# Patient Record
Sex: Male | Born: 1970 | Race: White | Hispanic: No | Smoking: Current every day smoker
Health system: Southern US, Community
[De-identification: ages and names within clinical notes are randomized; demographics above are authoritative.]

## PROBLEM LIST (undated history)

## (undated) DIAGNOSIS — I10 Essential (primary) hypertension: Secondary | ICD-10-CM

## (undated) DIAGNOSIS — E785 Hyperlipidemia, unspecified: Secondary | ICD-10-CM

## (undated) DIAGNOSIS — I82409 Acute embolism and thrombosis of unspecified deep veins of unspecified lower extremity: Secondary | ICD-10-CM

## (undated) DIAGNOSIS — C801 Malignant (primary) neoplasm, unspecified: Secondary | ICD-10-CM

## (undated) HISTORY — PX: TONSILLECTOMY: SUR1361

## (undated) HISTORY — PX: CHOLECYSTECTOMY: SHX55

## (undated) HISTORY — PX: HERNIA REPAIR: SHX51

---

## 2000-03-25 ENCOUNTER — Encounter: Admission: RE | Admit: 2000-03-25 | Discharge: 2000-04-03 | Payer: Self-pay | Admitting: Podiatry

## 2001-06-25 ENCOUNTER — Encounter: Payer: Self-pay | Admitting: Emergency Medicine

## 2001-06-25 ENCOUNTER — Emergency Department (HOSPITAL_COMMUNITY): Admission: AC | Admit: 2001-06-25 | Discharge: 2001-06-25 | Payer: Self-pay

## 2003-11-28 ENCOUNTER — Emergency Department (HOSPITAL_COMMUNITY): Admission: EM | Admit: 2003-11-28 | Discharge: 2003-11-28 | Payer: Self-pay | Admitting: Emergency Medicine

## 2004-01-16 ENCOUNTER — Emergency Department (HOSPITAL_COMMUNITY): Admission: EM | Admit: 2004-01-16 | Discharge: 2004-01-16 | Payer: Self-pay | Admitting: Emergency Medicine

## 2004-06-12 ENCOUNTER — Inpatient Hospital Stay (HOSPITAL_COMMUNITY): Admission: EM | Admit: 2004-06-12 | Discharge: 2004-06-20 | Payer: Self-pay | Admitting: Emergency Medicine

## 2004-07-26 ENCOUNTER — Ambulatory Visit (HOSPITAL_COMMUNITY): Admission: RE | Admit: 2004-07-26 | Discharge: 2004-07-26 | Payer: Self-pay | Admitting: Internal Medicine

## 2005-07-19 ENCOUNTER — Emergency Department (HOSPITAL_COMMUNITY): Admission: EM | Admit: 2005-07-19 | Discharge: 2005-07-19 | Payer: Self-pay | Admitting: Emergency Medicine

## 2005-10-01 ENCOUNTER — Emergency Department (HOSPITAL_COMMUNITY): Admission: EM | Admit: 2005-10-01 | Discharge: 2005-10-01 | Payer: Self-pay | Admitting: Emergency Medicine

## 2005-11-07 ENCOUNTER — Emergency Department (HOSPITAL_COMMUNITY): Admission: EM | Admit: 2005-11-07 | Discharge: 2005-11-07 | Payer: Self-pay | Admitting: Emergency Medicine

## 2005-12-09 ENCOUNTER — Emergency Department (HOSPITAL_COMMUNITY): Admission: EM | Admit: 2005-12-09 | Discharge: 2005-12-09 | Payer: Self-pay | Admitting: Emergency Medicine

## 2005-12-12 ENCOUNTER — Emergency Department (HOSPITAL_COMMUNITY): Admission: EM | Admit: 2005-12-12 | Discharge: 2005-12-12 | Payer: Self-pay | Admitting: Emergency Medicine

## 2006-03-17 IMAGING — RF DG ERCP WO/W SPHINCTEROTOMY
1 series · 8 of 8 positions shown · non-contrast
Comparison: none

CLINICAL DATA: bile duct stent removal
 ERCP WITH SPHINCTEROTOMY
 ERCP with sphincterotomy and stent removal performed by Dr. Manzoni. Images were obtained during the procedure. These demonstrate normal caliber common bile duct with what appear to be small filling defects in the common bile duct.  Following visualization of the filling defects, a sphincterotomy was performed and a balloon was passed with retrieval of multiple small stones from the duct.  Surgical drain noted right upper quadrant.  No biliary dilatation seen.
 IMPRESSION
 Small filling defects in the common bile duct with extraction of stones following sphincterotomy.

[Series 903: run · 8 of 8 slices shown]
[im 1/8]
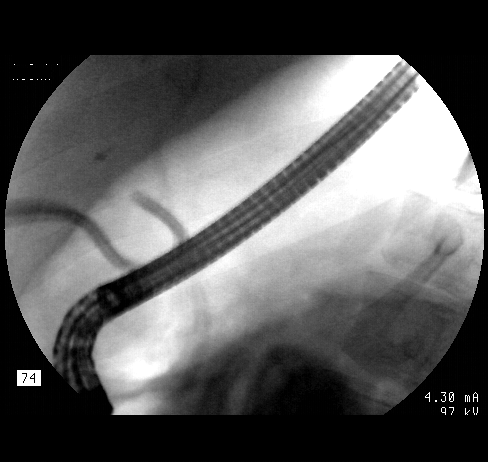
[im 2/8]
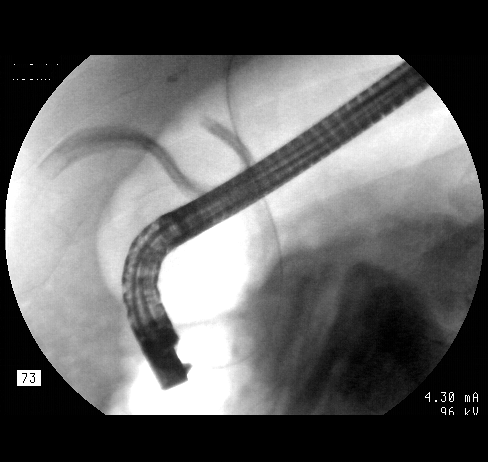
[im 3/8]
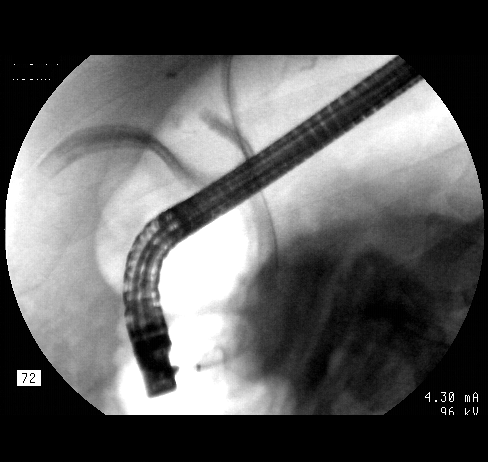
[im 4/8]
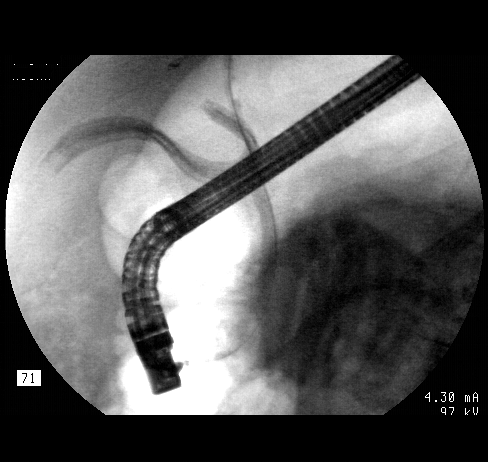
[im 5/8]
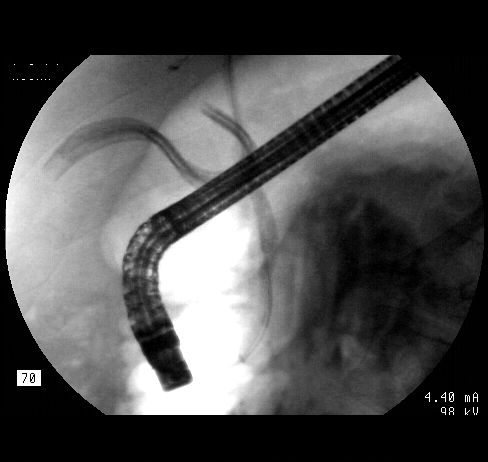
[im 6/8]
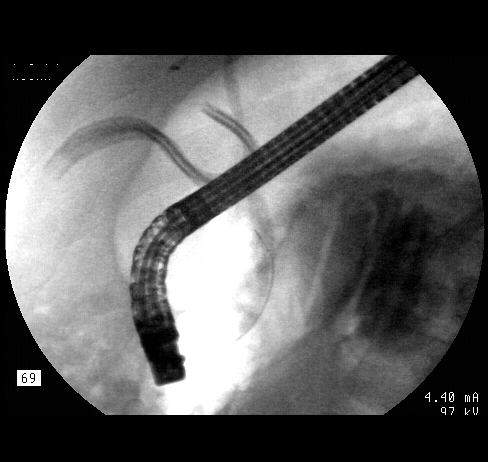
[im 7/8]
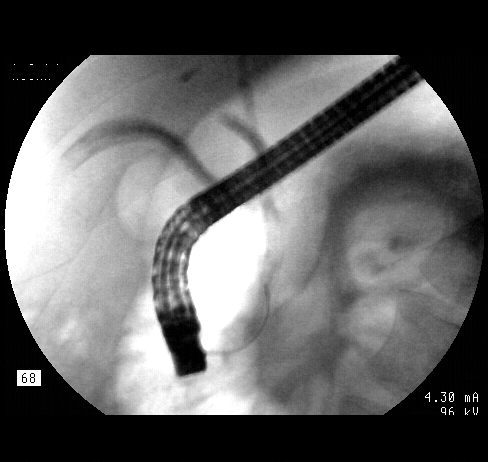
[im 8/8]
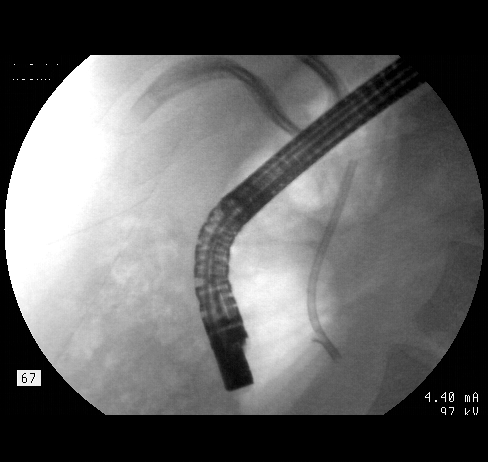

[8 of 8 positions shown; findings below may reference images not displayed]

## 2011-01-20 ENCOUNTER — Encounter: Payer: Self-pay | Admitting: Internal Medicine

## 2013-08-23 ENCOUNTER — Emergency Department (HOSPITAL_COMMUNITY)
Admission: EM | Admit: 2013-08-23 | Discharge: 2013-08-23 | Disposition: A | Discharge status: Home / Self Care | Payer: Self-pay | Attending: Emergency Medicine | Admitting: Emergency Medicine

## 2013-08-23 ENCOUNTER — Encounter (HOSPITAL_COMMUNITY): Payer: Self-pay | Admitting: *Deleted

## 2013-08-23 DIAGNOSIS — Z8639 Personal history of other endocrine, nutritional and metabolic disease: Secondary | ICD-10-CM | POA: Insufficient documentation

## 2013-08-23 DIAGNOSIS — R110 Nausea: Secondary | ICD-10-CM | POA: Insufficient documentation

## 2013-08-23 DIAGNOSIS — Z79899 Other long term (current) drug therapy: Secondary | ICD-10-CM | POA: Insufficient documentation

## 2013-08-23 DIAGNOSIS — K029 Dental caries, unspecified: Secondary | ICD-10-CM | POA: Insufficient documentation

## 2013-08-23 DIAGNOSIS — Z862 Personal history of diseases of the blood and blood-forming organs and certain disorders involving the immune mechanism: Secondary | ICD-10-CM | POA: Insufficient documentation

## 2013-08-23 DIAGNOSIS — K047 Periapical abscess without sinus: Secondary | ICD-10-CM | POA: Insufficient documentation

## 2013-08-23 DIAGNOSIS — R220 Localized swelling, mass and lump, head: Secondary | ICD-10-CM | POA: Insufficient documentation

## 2013-08-23 HISTORY — DX: Hyperlipidemia, unspecified: E78.5

## 2013-08-23 MED ORDER — AMOXICILLIN 500 MG PO CAPS: 500.0000 mg | ORAL_CAPSULE | Freq: Three times a day (TID) | ORAL | Status: AC

## 2013-08-23 MED ORDER — IBUPROFEN 800 MG PO TABS: 800.0000 mg | ORAL_TABLET | Freq: Three times a day (TID) | ORAL | Status: DC

## 2013-08-23 MED ORDER — TRAMADOL HCL 50 MG PO TABS: 50.0000 mg | ORAL_TABLET | Freq: Four times a day (QID) | ORAL | Status: DC | PRN

## 2013-08-23 NOTE — ED Provider Notes (Signed)
Medical screening examination/treatment/procedure(s) were performed by non-physician practitioner and as supervising physician I was immediately available for consultation/collaboration.   Kaida Games L Salinda Snedeker, MD 08/23/13 2022 

## 2013-08-23 NOTE — ED Notes (Signed)
Dental pain lt mandibular molar

## 2013-08-23 NOTE — ED Provider Notes (Signed)
CSN: [id number]     Arrival date & time 08/23/13  1424 History     First MD Initiated Contact with Patient 08/23/13 1447     Chief Complaint  Patient presents with  . Dental Pain   (Consider location/radiation/quality/duration/timing/severity/associated sxs/prior Treatment) HPI Comments: Carlos Sullivan is a 42 y.o. Male with 1 day history of dental pain and gingival swelling.   The patient has a history of decay and an old filling in the tooth which has recently started to cause pain, severe since yesterday.  There has been no fevers,  Chills or vomiting but states the pain is making him nauseated.  He has no complaint of difficulty swallowing,  Although chewing makes pain worse.  The patient has tried tylenol without relief of symptoms.         The history is provided by the patient.    Past Medical History  Diagnosis Date  . Hyperlipemia    Past Surgical History  Procedure Laterality Date  . Cholecystectomy     History reviewed. No pertinent family history. History  Substance Use Topics  . Smoking status: Never Smoker   . Smokeless tobacco: Not on file  . Alcohol Use: No    Review of Systems  Constitutional: Negative for fever.  HENT: Positive for dental problem. Negative for sore throat, facial swelling, neck pain and neck stiffness.   Respiratory: Negative for shortness of breath.     Allergies  Review of patient's allergies indicates no known allergies.  Home Medications   Current Outpatient Rx  Name  Route  Sig  Dispense  Refill  . citalopram (CELEXA) 10 MG tablet   Oral   Take 10 mg by mouth daily.         . traZODone (DESYREL) 100 MG tablet   Oral   Take 150 mg by mouth at bedtime.         Marland Kitchen amoxicillin (AMOXIL) 500 MG capsule   Oral   Take 1 capsule (500 mg total) by mouth 3 (three) times daily.   30 capsule   0   . ibuprofen (ADVIL,MOTRIN) 800 MG tablet   Oral   Take 1 tablet (800 mg total) by mouth 3 (three) times daily.   21  tablet   0   . traMADol (ULTRAM) 50 MG tablet   Oral   Take 1 tablet (50 mg total) by mouth every 6 (six) hours as needed for pain.   15 tablet   0    BP 141/87  Pulse 97  Temp(Src) 98.2 F (36.8 C) (Oral)  Resp 20  Ht 6\' 3"  (1.905 m)  Wt 265 lb (120.203 kg)  BMI 33.12 kg/m2  SpO2 98% Physical Exam  Nursing note and vitals reviewed. Constitutional: He is oriented to person, place, and time. He appears well-developed and well-nourished. No distress.  HENT:  Head: Normocephalic and atraumatic.  Right Ear: Tympanic membrane and external ear normal.  Left Ear: Tympanic membrane and external ear normal.  Nose: Nose normal.  Mouth/Throat: Oropharynx is clear and moist and mucous membranes are normal. No oral lesions. No trismus in the jaw. Dental abscesses and dental caries present.  Old filling in left lower 2nd molar tooth which appears fractured.  There is gingival hyperemia, ttp, no fluctuance.  Tender along left mandible without erythema or induration, although he has mild edema. Left submandibular node tender.  Eyes: Conjunctivae are normal.  Neck: Normal range of motion. Neck supple.  Cardiovascular: Normal rate, regular rhythm,  normal heart sounds and intact distal pulses.   Pulmonary/Chest: Effort normal and breath sounds normal. He has no wheezes.  Abdominal: Soft. Bowel sounds are normal. He exhibits no distension. There is no tenderness.  Musculoskeletal: Normal range of motion.  Lymphadenopathy:    He has no cervical adenopathy.  Neurological: He is alert and oriented to person, place, and time.  Skin: Skin is warm and dry. No erythema.  Psychiatric: He has a normal mood and affect.    ED Course   Procedures (including critical care time)  Labs Reviewed - No data to display No results found. 1. Dental abscess     MDM  Pt was prescribed ibuprofen, amoxil, tramadol.  Dental referrals given.  Pt stable.  No abscess on exam.  Burgess Amor, PA-C 08/23/13 1616

## 2013-09-12 ENCOUNTER — Encounter (HOSPITAL_COMMUNITY): Payer: Self-pay | Admitting: *Deleted

## 2013-09-12 ENCOUNTER — Emergency Department (HOSPITAL_COMMUNITY)
Admission: EM | Admit: 2013-09-12 | Discharge: 2013-09-12 | Disposition: A | Discharge status: Home / Self Care | Payer: Self-pay | Attending: Emergency Medicine | Admitting: Emergency Medicine

## 2013-09-12 DIAGNOSIS — Z862 Personal history of diseases of the blood and blood-forming organs and certain disorders involving the immune mechanism: Secondary | ICD-10-CM | POA: Insufficient documentation

## 2013-09-12 DIAGNOSIS — Z8639 Personal history of other endocrine, nutritional and metabolic disease: Secondary | ICD-10-CM | POA: Insufficient documentation

## 2013-09-12 DIAGNOSIS — K5900 Constipation, unspecified: Secondary | ICD-10-CM | POA: Insufficient documentation

## 2013-09-12 DIAGNOSIS — Z79899 Other long term (current) drug therapy: Secondary | ICD-10-CM | POA: Insufficient documentation

## 2013-09-12 MED ORDER — PEG 3350-KCL-NABCB-NACL-NASULF 240 G PO SOLR: 4000.0000 mL | Freq: Once | ORAL | Status: DC

## 2013-09-12 NOTE — ED Notes (Signed)
Pt reporting last BM was 3 days ago.  States that he has taken mineral oil about 3 times per day, for past 2-3 days, with no relief.

## 2013-09-12 NOTE — ED Provider Notes (Signed)
CSN: [id number]     Arrival date & time 09/12/13  0017 History   First MD Initiated Contact with Patient 09/12/13 0258     Chief Complaint  Patient presents with  . Constipation   (Consider location/radiation/quality/duration/timing/severity/associated sxs/prior Treatment) Patient is a 42 y.o. male presenting with constipation. The history is provided by the patient.  Constipation He states that he has not had a bowel movement in the last 3 days. He feels bloated and has tried to treat himself with mineral oil without benefit. He tried a digital rectal exam to move the stool but was not successful. He feels like there is just a large piece of stool which he is not able to pass. Denies nausea vomiting denies fever or chills.  Past Medical History  Diagnosis Date  . Hyperlipemia    Past Surgical History  Procedure Laterality Date  . Cholecystectomy     History reviewed. No pertinent family history. History  Substance Use Topics  . Smoking status: Never Smoker   . Smokeless tobacco: Not on file  . Alcohol Use: No    Review of Systems  Gastrointestinal: Positive for constipation.  All other systems reviewed and are negative.    Allergies  Codeine  Home Medications   Current Outpatient Rx  Name  Route  Sig  Dispense  Refill  . citalopram (CELEXA) 10 MG tablet   Oral   Take 10 mg by mouth daily.         . traZODone (DESYREL) 100 MG tablet   Oral   Take 150 mg by mouth at bedtime.         Marland Kitchen ibuprofen (ADVIL,MOTRIN) 800 MG tablet   Oral   Take 1 tablet (800 mg total) by mouth 3 (three) times daily.   21 tablet   0   . traMADol (ULTRAM) 50 MG tablet   Oral   Take 1 tablet (50 mg total) by mouth every 6 (six) hours as needed for pain.   15 tablet   0    BP 134/91  Pulse 115  Temp(Src) 99 F (37.2 C)  Resp 20  Ht 6\' 3"  (1.905 m)  Wt 265 lb (120.203 kg)  BMI 33.12 kg/m2 Physical Exam  Nursing note and vitals reviewed.  42 year old male, resting  comfortably and in no acute distress. Vital signs are significant for borderline hypertension with blood pressure 134/91, and tachycardia with heart rate 115.  Head is normocephalic and atraumatic. PERRLA, EOMI. Oropharynx is clear. Neck is nontender and supple without adenopathy or JVD. Back is nontender and there is no CVA tenderness. Lungs are clear without rales, wheezes, or rhonchi. Chest is nontender. Heart has regular rate and rhythm without murmur. Abdomen is soft, flat, nontender without masses or hepatosplenomegaly and peristalsis is normoactive. Rectal: Normal sphincter tone, moderate amount of stool which is moderately hard but not causing an impaction. Extremities have no cyanosis or edema, full range of motion is present. Skin is warm and dry without rash. Neurologic: Mental status is normal, cranial nerves are intact, there are no motor or sensory deficits.  ED Course  Procedures (including critical care time)  MDM   1. Constipation    Constipation without fecal impaction. He is discharged with a prescription for polyethylene glycol and given instructions regarding prevention of constipation.    Dione Booze, MD 09/12/13 [phone]

## 2014-06-04 ENCOUNTER — Emergency Department (HOSPITAL_COMMUNITY)
Admission: EM | Admit: 2014-06-04 | Discharge: 2014-06-05 | Disposition: A | Discharge status: Home / Self Care | Payer: Self-pay | Attending: Emergency Medicine | Admitting: Emergency Medicine

## 2014-06-04 ENCOUNTER — Encounter (HOSPITAL_COMMUNITY): Payer: Self-pay | Admitting: Emergency Medicine

## 2014-06-04 DIAGNOSIS — Z862 Personal history of diseases of the blood and blood-forming organs and certain disorders involving the immune mechanism: Secondary | ICD-10-CM | POA: Insufficient documentation

## 2014-06-04 DIAGNOSIS — K029 Dental caries, unspecified: Secondary | ICD-10-CM | POA: Insufficient documentation

## 2014-06-04 DIAGNOSIS — Z79899 Other long term (current) drug therapy: Secondary | ICD-10-CM | POA: Insufficient documentation

## 2014-06-04 DIAGNOSIS — F17200 Nicotine dependence, unspecified, uncomplicated: Secondary | ICD-10-CM | POA: Insufficient documentation

## 2014-06-04 DIAGNOSIS — K047 Periapical abscess without sinus: Secondary | ICD-10-CM | POA: Insufficient documentation

## 2014-06-04 DIAGNOSIS — Z8639 Personal history of other endocrine, nutritional and metabolic disease: Secondary | ICD-10-CM | POA: Insufficient documentation

## 2014-06-04 DIAGNOSIS — Z791 Long term (current) use of non-steroidal anti-inflammatories (NSAID): Secondary | ICD-10-CM | POA: Insufficient documentation

## 2014-06-04 NOTE — ED Provider Notes (Signed)
CSN: [id number]     Arrival date & time 06/04/14  2304 History   First MD Initiated Contact with Patient 06/04/14 2354     Chief Complaint  Patient presents with  . Dental Pain     (Consider location/radiation/quality/duration/timing/severity/associated sxs/prior Treatment) Patient is a 43 y.o. male presenting with tooth pain. The history is provided by the patient.  Dental Pain Location:  Lower Lower teeth location:  31/RL 2nd molar Quality:  Throbbing Severity:  Moderate Onset quality:  Gradual Duration:  2 days Timing:  Constant Progression:  Worsening Chronicity:  New Context: dental caries and poor dentition   Relieved by:  Nothing Worsened by:  Cold food/drink and pressure Ineffective treatments:  NSAIDs Associated symptoms: facial pain    Carlos Sullivan is a 43 y.o. male who presents to the ED with dental pain. He states he has been having some problems for the past month but last night the pain got really bad. He has several decayed teeth. He took Aleve, used an ice pack and the pain was still severe.   Past Medical History  Diagnosis Date  . Hyperlipemia    Past Surgical History  Procedure Laterality Date  . Cholecystectomy     No family history on file. History  Substance Use Topics  . Smoking status: Current Every Day Smoker  . Smokeless tobacco: Not on file  . Alcohol Use: No    Review of Systems Negative except as stated in HPI   Allergies  Codeine and Hydrocodone  Home Medications   Prior to Admission medications   Medication Sig Start Date End Date Taking? Authorizing Provider  ibuprofen (ADVIL,MOTRIN) 800 MG tablet Take 1 tablet (800 mg total) by mouth 3 (three) times daily. 08/23/13  Yes Raynelle FanningJulie Idol, PA-C  citalopram (CELEXA) 10 MG tablet Take 10 mg by mouth daily.    Historical Provider, MD  polyethylene glycol (COLYTE) 240 G solution Take 4,000 mLs by mouth once. Drink 8 ounces every 15 minutes until you are passing clear liquid 09/12/13    Dione Boozeavid Glick, MD  traMADol (ULTRAM) 50 MG tablet Take 1 tablet (50 mg total) by mouth every 6 (six) hours as needed for pain. 08/23/13   Burgess AmorJulie Idol, PA-C  traZODone (DESYREL) 100 MG tablet Take 150 mg by mouth at bedtime.    Historical Provider, MD   BP 145/92  Pulse 72  Temp(Src) 99.1 F (37.3 C) (Oral)  Resp 20  Ht 6\' 3"  (1.905 m)  Wt 245 lb (111.131 kg)  BMI 30.62 kg/m2  SpO2 98% Physical Exam  Nursing note and vitals reviewed. Constitutional: He is oriented to person, place, and time. He appears well-developed and well-nourished. No distress.  HENT:  Head: Normocephalic.  Right Ear: Tympanic membrane normal.  Left Ear: Tympanic membrane normal.  Mouth/Throat: Uvula is midline, oropharynx is clear and moist and mucous membranes are normal.    Right lower second molar with decay and swelling and erythema of the gum surrounding the tooth.   Eyes: Conjunctivae and EOM are normal.  Neck: Neck supple.  Cardiovascular: Normal rate.   Pulmonary/Chest: Effort normal.  Abdominal: Soft. There is no tenderness.  Musculoskeletal: Normal range of motion.  Lymphadenopathy:    He has no cervical adenopathy.  Neurological: He is alert and oriented to person, place, and time. No cranial nerve deficit.  Skin: Skin is warm and dry.  Psychiatric: He has a normal mood and affect. His behavior is normal.    ED Course  Procedures  MDM  43 y.o. male with dental pain due to abscess and dental caries. Will treat with antibiotics and pain medication. He will see a dentist as soon as possible. Discussed with the patient and all questioned fully answered.    Medication List    TAKE these medications       amoxicillin 500 MG capsule  Commonly known as:  AMOXIL  Take 1 capsule (500 mg total) by mouth 3 (three) times daily.     traMADol 50 MG tablet  Commonly known as:  ULTRAM  Take 1 tablet (50 mg total) by mouth every 6 (six) hours as needed.      ASK your doctor about these medications         citalopram 10 MG tablet  Commonly known as:  CELEXA  Take 10 mg by mouth daily.     ibuprofen 800 MG tablet  Commonly known as:  ADVIL,MOTRIN  Take 1 tablet (800 mg total) by mouth 3 (three) times daily.  Ask about: Which instructions should I use?     ibuprofen 800 MG tablet  Commonly known as:  ADVIL,MOTRIN  Take 1 tablet (800 mg total) by mouth 3 (three) times daily.  Ask about: Which instructions should I use?     polyethylene glycol 240 G solution  Commonly known as:  COLYTE  Take 4,000 mLs by mouth once. Drink 8 ounces every 15 minutes until you are passing clear liquid     traZODone 100 MG tablet  Commonly known as:  DESYREL  Take 150 mg by mouth at bedtime.         [address] [place], [place] 06/05/14 [id number]

## 2014-06-04 NOTE — ED Notes (Signed)
Patient c/o right lower back tooth pain since Friday.

## 2014-06-05 MED ORDER — TRAMADOL HCL 50 MG PO TABS
50.0000 mg | ORAL_TABLET | Freq: Once | ORAL | Status: AC
  Administered 2014-06-05: 50 mg via ORAL
  Filled 2014-06-05: qty 1

## 2014-06-05 MED ORDER — AMOXICILLIN 250 MG PO CAPS
500.0000 mg | ORAL_CAPSULE | Freq: Once | ORAL | Status: AC
  Administered 2014-06-05: 500 mg via ORAL
  Filled 2014-06-05: qty 2

## 2014-06-05 MED ORDER — TRAMADOL HCL 50 MG PO TABS: 50.0000 mg | ORAL_TABLET | Freq: Four times a day (QID) | ORAL | Status: DC | PRN

## 2014-06-05 MED ORDER — IBUPROFEN 800 MG PO TABS: 800.0000 mg | ORAL_TABLET | Freq: Three times a day (TID) | ORAL | Status: DC

## 2014-06-05 MED ORDER — AMOXICILLIN 500 MG PO CAPS: 500.0000 mg | ORAL_CAPSULE | Freq: Three times a day (TID) | ORAL | Status: DC

## 2014-06-05 NOTE — Discharge Instructions (Signed)
°  Dental Abscess °A dental abscess is a collection of infected fluid (pus) from a bacterial infection in the inner part of the tooth (pulp). It usually occurs at the end of the tooth's root.  °CAUSES  °· Severe tooth decay. °· Trauma to the tooth that allows bacteria to enter into the pulp, such as a broken or chipped tooth. °SYMPTOMS  °· Severe pain in and around the infected tooth. °· Swelling and redness around the abscessed tooth or in the mouth or face. °· Tenderness. °· Pus drainage. °· Bad breath. °· Bitter taste in the mouth. °· Difficulty swallowing. °· Difficulty opening the mouth. °· Nausea. °· Vomiting. °· Chills. °· Swollen neck glands. °DIAGNOSIS  °· A medical and dental history will be taken. °· An examination will be performed by tapping on the abscessed tooth. °· X-rays may be taken of the tooth to identify the abscess. °TREATMENT °The goal of treatment is to eliminate the infection. You may be prescribed antibiotic medicine to stop the infection from spreading. A root canal may be performed to save the tooth. If the tooth cannot be saved, it may be pulled (extracted) and the abscess may be drained.  °HOME CARE INSTRUCTIONS °· Only take over-the-counter or prescription medicines for pain, fever, or discomfort as directed by your caregiver. °· Rinse your mouth (gargle) often with salt water (¼ tsp salt in 8 oz [250 ml] of warm water) to relieve pain or swelling. °· Do not drive after taking pain medicine (narcotics). °· Do not apply heat to the outside of your face. °· Return to your dentist for further treatment as directed. °SEEK MEDICAL CARE IF: °· Your pain is not helped by medicine. °· Your pain is getting worse instead of better. °SEEK IMMEDIATE MEDICAL CARE IF: °· You have a fever or persistent symptoms for more than 2 3 days. °· You have a fever and your symptoms suddenly get worse. °· You have chills or a very bad headache. °· You have problems breathing or swallowing. °· You have trouble  opening your mouth. °· You have swelling in the neck or around the eye. °Document Released: 12/16/2005 Document Revised: 09/09/2012 Document Reviewed: 03/26/2011 °[employer]® Patient Information ©2014 [employer]. ° ° °

## 2014-06-05 NOTE — ED Provider Notes (Signed)
Medical screening examination/treatment/procedure(s) were performed by non-physician practitioner and as supervising physician I was immediately available for consultation/collaboration.   Mainor Hellmann, MD 06/05/14 0342 

## 2024-05-11 ENCOUNTER — Other Ambulatory Visit: Payer: Self-pay

## 2024-05-11 ENCOUNTER — Encounter (HOSPITAL_COMMUNITY): Payer: Self-pay

## 2024-05-11 ENCOUNTER — Emergency Department (HOSPITAL_COMMUNITY)
Admission: EM | Admit: 2024-05-11 | Discharge: 2024-05-11 | Discharge status: Against Medical Advice | Payer: Self-pay | Attending: Emergency Medicine | Admitting: Emergency Medicine

## 2024-05-11 DIAGNOSIS — Z5321 Procedure and treatment not carried out due to patient leaving prior to being seen by health care provider: Secondary | ICD-10-CM | POA: Insufficient documentation

## 2024-05-11 DIAGNOSIS — M7989 Other specified soft tissue disorders: Secondary | ICD-10-CM | POA: Insufficient documentation

## 2024-05-11 NOTE — ED Triage Notes (Signed)
 BIB EMS from [facility] for left leg swelling that is worsening over the last 3 weeks. Leg is significantly swollen all the way up to hip. Denies pain, unless he hits leg on something.

## 2024-05-11 NOTE — ED Notes (Signed)
 Pt left.

## 2024-05-12 ENCOUNTER — Other Ambulatory Visit: Payer: Self-pay

## 2024-05-12 ENCOUNTER — Encounter (HOSPITAL_COMMUNITY): Payer: Self-pay

## 2024-05-12 ENCOUNTER — Emergency Department (EMERGENCY_DEPARTMENT_HOSPITAL)
Admit: 2024-05-12 | Discharge: 2024-05-12 | Disposition: A | Discharge status: Home / Self Care | Payer: Self-pay | Attending: Emergency Medicine | Admitting: Emergency Medicine

## 2024-05-12 ENCOUNTER — Emergency Department (HOSPITAL_COMMUNITY): Payer: Self-pay

## 2024-05-12 ENCOUNTER — Emergency Department (HOSPITAL_COMMUNITY)
Admission: EM | Admit: 2024-05-12 | Discharge: 2024-05-12 | Discharge status: Against Medical Advice | Payer: Self-pay | Attending: Emergency Medicine | Admitting: Emergency Medicine

## 2024-05-12 DIAGNOSIS — I82412 Acute embolism and thrombosis of left femoral vein: Secondary | ICD-10-CM | POA: Insufficient documentation

## 2024-05-12 DIAGNOSIS — Z79899 Other long term (current) drug therapy: Secondary | ICD-10-CM | POA: Insufficient documentation

## 2024-05-12 DIAGNOSIS — M7989 Other specified soft tissue disorders: Secondary | ICD-10-CM

## 2024-05-12 DIAGNOSIS — M5431 Sciatica, right side: Secondary | ICD-10-CM | POA: Insufficient documentation

## 2024-05-12 DIAGNOSIS — Z5329 Procedure and treatment not carried out because of patient's decision for other reasons: Secondary | ICD-10-CM | POA: Insufficient documentation

## 2024-05-12 DIAGNOSIS — I82422 Acute embolism and thrombosis of left iliac vein: Secondary | ICD-10-CM | POA: Insufficient documentation

## 2024-05-12 DIAGNOSIS — C7951 Secondary malignant neoplasm of bone: Secondary | ICD-10-CM | POA: Insufficient documentation

## 2024-05-12 DIAGNOSIS — C7949 Secondary malignant neoplasm of other parts of nervous system: Secondary | ICD-10-CM | POA: Insufficient documentation

## 2024-05-12 DIAGNOSIS — I10 Essential (primary) hypertension: Secondary | ICD-10-CM | POA: Insufficient documentation

## 2024-05-12 HISTORY — DX: Essential (primary) hypertension: I10

## 2024-05-12 HISTORY — DX: Malignant (primary) neoplasm, unspecified: C80.1

## 2024-05-12 LAB — CBC WITH DIFFERENTIAL/PLATELET
Abs Immature Granulocytes: 0.01 10*3/uL (ref 0.00–0.07)
Basophils Absolute: 0 10*3/uL (ref 0.0–0.1)
Basophils Relative: 1 %
Eosinophils Absolute: 0.2 10*3/uL (ref 0.0–0.5)
Eosinophils Relative: 3 %
HCT: 31.7 % — ABNORMAL LOW (ref 39.0–52.0)
Hemoglobin: 10.2 g/dL — ABNORMAL LOW (ref 13.0–17.0)
Immature Granulocytes: 0 %
Lymphocytes Relative: 30 %
Lymphs Abs: 1.5 10*3/uL (ref 0.7–4.0)
MCH: 28.8 pg (ref 26.0–34.0)
MCHC: 32.2 g/dL (ref 30.0–36.0)
MCV: 89.5 fL (ref 80.0–100.0)
Monocytes Absolute: 0.5 10*3/uL (ref 0.1–1.0)
Monocytes Relative: 9 %
Neutro Abs: 2.9 10*3/uL (ref 1.7–7.7)
Neutrophils Relative %: 57 %
Platelets: 126 10*3/uL — ABNORMAL LOW (ref 150–400)
RBC: 3.54 MIL/uL — ABNORMAL LOW (ref 4.22–5.81)
RDW: 14.2 % (ref 11.5–15.5)
WBC: 5.1 10*3/uL (ref 4.0–10.5)
nRBC: 0 % (ref 0.0–0.2)

## 2024-05-12 LAB — BASIC METABOLIC PANEL WITH GFR
Anion gap: 6 (ref 5–15)
BUN: 9 mg/dL (ref 6–20)
CO2: 24 mmol/L (ref 22–32)
Calcium: 8.6 mg/dL — ABNORMAL LOW (ref 8.9–10.3)
Chloride: 109 mmol/L (ref 98–111)
Creatinine, Ser: 0.72 mg/dL (ref 0.61–1.24)
GFR, Estimated: >60 mL/min (ref >60)
Glucose, Bld: 82 mg/dL (ref 70–99)
Potassium: 3.8 mmol/L (ref 3.5–5.1)
Sodium: 139 mmol/L (ref 135–145)

## 2024-05-12 LAB — TSH: TSH: 0.886 u[IU]/mL (ref 0.350–4.500)

## 2024-05-12 LAB — BRAIN NATRIURETIC PEPTIDE: B Natriuretic Peptide: 28.4 pg/mL (ref 0.0–100.0)

## 2024-05-12 MED ORDER — SODIUM CHLORIDE (PF) 0.9 % IJ SOLN
INTRAMUSCULAR | Status: AC
  Filled 2024-05-12: qty 250

## 2024-05-12 MED ORDER — GADOBUTROL 1 MMOL/ML IV SOLN
10.0000 mL | Freq: Once | INTRAVENOUS | Status: AC | PRN
  Administered 2024-05-12: 10 mL via INTRAVENOUS

## 2024-05-12 MED ORDER — ENOXAPARIN SODIUM 100 MG/ML IJ SOSY
1.0000 mg/kg | PREFILLED_SYRINGE | Freq: Once | INTRAMUSCULAR | Status: AC
  Administered 2024-05-12: 97.5 mg via SUBCUTANEOUS
  Filled 2024-05-12: qty 1

## 2024-05-12 MED ORDER — SODIUM CHLORIDE (PF) 0.9 % IJ SOLN
INTRAMUSCULAR | Status: AC
  Filled 2024-05-12: qty 50

## 2024-05-12 MED ORDER — IOHEXOL 350 MG/ML SOLN
125.0000 mL | Freq: Once | INTRAVENOUS | Status: AC | PRN
  Administered 2024-05-12: 125 mL via INTRAVENOUS

## 2024-05-12 NOTE — ED Provider Notes (Signed)
 [place] EMERGENCY DEPARTMENT AT [facility] Provider Note  CSN: [id number] Arrival date & time: 05/12/24 0848  Chief Complaint(s) Leg Swelling  HPI Carlos Sullivan is a 53 y.o. male with past medical history as below, significant for HLD, HTN who presents to the ED with complaint of leg swelling  Recently got out of jail, reports has been having grossly worsening leg swelling over the past few days.  He was seen urgent care yesterday, advised come to the ER for ultrasound but by the time he got her ultrasound had gone home for the day.  Returns today for evaluation.  He has some tightness, pain to his left lower extremity due to swelling.  No fevers or chills, nausea or vomiting, no chest pain or dyspnea.  No scrotal or abdominal swelling.  He denies history of CHF.  No wounds or injuries to his leg.  Past Medical History Past Medical History:  Diagnosis Date   Cancer (HCC)    Hyperlipemia    Hypertension    There are no active problems to display for this patient.  Home Medication(s) Prior to Admission medications   Medication Sig Start Date End Date Taking? Authorizing Provider  Buprenorphine HCl-Naloxone HCl 8-2 MG FILM Place 1 Film under the tongue 2 (two) times daily. 04/24/24  Yes [provider]  ibuprofen  (ADVIL ) 800 MG tablet Take 800 mg by mouth in the morning and at bedtime.   Yes [provider]  losartan (COZAAR) 25 MG tablet Take 25 mg by mouth daily.   Yes [provider]                                                                                                                                    Past Surgical History Past Surgical History:  Procedure Laterality Date   CHOLECYSTECTOMY     HERNIA REPAIR     Family History History reviewed. No pertinent family history.  Social History Social History   Tobacco Use   Smoking status: Every Day  Substance Use Topics   Alcohol use: No   Drug use: No   Allergies Bee  venom  Review of Systems A thorough review of systems was obtained and all systems are negative except as noted in the HPI and PMH.   Physical Exam Vital Signs  I have reviewed the triage vital signs BP (!) 151/92   Pulse (!) 104   Temp (!) 97.4 F (36.3 C) (Oral)   Resp 16   Ht 6\' 3"  (1.905 m)   Wt 98 kg   SpO2 100%   BMI 27.00 kg/m  Physical Exam Vitals and nursing note reviewed.  Constitutional:      General: He is not in acute distress.    Appearance: Normal appearance. He is well-developed. He is not ill-appearing.  HENT:     Head: Normocephalic and atraumatic.     Right Ear: External ear  normal.     Left Ear: External ear normal.     Nose: Nose normal.     Mouth/Throat:     Mouth: Mucous membranes are moist.  Eyes:     General: No scleral icterus.       Right eye: No discharge.        Left eye: No discharge.  Cardiovascular:     Rate and Rhythm: Normal rate.  Pulmonary:     Effort: Pulmonary effort is normal. No respiratory distress.     Breath sounds: No stridor.  Abdominal:     General: Abdomen is flat. There is no distension.     Tenderness: There is no guarding.  Musculoskeletal:        General: No deformity.     Cervical back: No rigidity.     Comments: Left lower extremity nonpitting edema to the calf and thigh.  He has pitting pedal edema to left lower extremity.  LE NVI  Skin:    General: Skin is warm and dry.     Coloration: Skin is not cyanotic, jaundiced or pale.  Neurological:     Mental Status: He is alert and oriented to person, place, and time.     GCS: GCS eye subscore is 4. GCS verbal subscore is 5. GCS motor subscore is 6.  Psychiatric:        Speech: Speech normal.        Behavior: Behavior normal. Behavior is cooperative.     ED Results and Treatments Labs (all labs ordered are listed, but only abnormal results are displayed) Labs Reviewed  BASIC METABOLIC PANEL WITH GFR - Abnormal; Notable for the following components:       Result Value   Calcium 8.6 (*)    All other components within normal limits  TSH  CBC WITH DIFFERENTIAL/PLATELET                                                                                                                          Radiology CT ANGIO AO+BIFEM W & OR WO CONTRAST Result Date: 05/12/2024 CLINICAL DATA:  Left leg swelling, ?phlegmasia EXAM: CT ANGIOGRAPHY CHEST WITH CONTRAST TECHNIQUE: Multidetector CT imaging of the abdomen and pelvis with run-off to both lower extremities was performed using the standard protocol during bolus administration of intravenous contrast. Multiplanar CT image reconstructions and MIPs were obtained to evaluate the vascular anatomy. RADIATION DOSE REDUCTION: This exam was performed according to the departmental dose-optimization program which includes automated exposure control, adjustment of the mA and/or kV according to patient size and/or use of iterative reconstruction technique. CONTRAST:  125mL OMNIPAQUE IOHEXOL 350 MG/ML SOLN COMPARISON:  None Available. FINDINGS: VASCULAR Aorta: No aneurysm or aortic dissection. No acute thrombus. Celiac: Patent without aneurysm, dissection, or hemodynamically significant stenosis. SMA: Patent without aneurysm, dissection, or hemodynamically significant stenosis. Renals: Patent without aneurysm, dissection, or hemodynamically significant stenosis. IMA: Patent without aneurysm, dissection, or hemodynamically significant stenosis. RIGHT Lower Extremity Inflow: Common, internal  and external iliac arteries are patent without aneurysm, acute thrombus, or dissection. No plaque or hemodynamically significant stenosis. Outflow: Common, superficial and profunda femoral, and the popliteal arteries are patent without acute thrombus, aneurysm, or dissection. No plaque or hemodynamically significant stenosis. Runoff: Patent three vessel runoff to the foot. LEFT Lower Extremity Inflow: Common, internal and external iliac arteries are  patent without aneurysm, acute thrombus, or dissection. No plaque or hemodynamically significant stenosis. Outflow: Common, superficial and profunda femoral, and the popliteal arteries are patent without acute thrombus, aneurysm, or dissection. No plaque or hemodynamically significant stenosis. Runoff: Patent three vessel runoff to the foot. Veins: Not evaluated due to the phase of contrast. Review of the MIP images confirms the above findings. NON-VASCULAR Lower chest: No focal airspace consolidation or pleural effusion. Hepatobiliary: No mass.Cholecystectomy.No intrahepatic or extrahepatic biliary ductal dilation. Pancreas: No mass or main ductal dilation.No peripancreatic inflammation or fluid collection. Spleen: Normal size. No mass. Adrenals/Urinary Tract: No adrenal masses. No renal mass. No hydronephrosis or nephrolithiasis. Circumferential wall thickening of the urinary bladder. Stomach/Bowel: The stomach is decompressed without focal abnormality. No small bowel wall thickening or inflammation. No small bowel obstruction. The appendix was not visualized. No right lower quadrant or pericecal inflammatory changes to suggest acute appendicitis. Mucosal hyperenhancement and mild wall thickening in the rectosigmoid colon. Presacral stranding also present. Vascular/Lymphatic: Mildly enlarged left external iliac chain lymph node measuring 1.3 cm (axial 219). Reproductive: No prostatomegaly.no free pelvic fluid. Other: No pneumoperitoneum, ascites, or mesenteric inflammation. Musculoskeletal: No acute fracture. Soft tissue mass within the spinal canal causing bony expansion and overlying destruction of the sacrum, centered at S2, measuring approximately 3.1 x 5.3 x 5.2 cm (axial 196). This causes significant narrowing of the S2 neural foramina.Moderate diffuse subcutaneous edema throughout the left leg with diffuse skin thickening present. Multilevel degenerative disc disease of the spine. IMPRESSION: VASCULAR 1. No  aortic aneurysm or aortic dissection. 2. No acute thrombus or hemodynamically significant stenosis within the inflow or outflow vessels to either leg. Patent three-vessel runoff to the feet bilaterally. NON-VASCULAR 1. Mucosal hyperenhancement and mild wall thickening of the rectosigmoid colon with presacral stranding, which may reflect changes of an infectious or inflammatory proctocolitis. 2. Soft tissue mass within the spinal canal causing bony expansion and overlying destruction of the sacral bone, centered at S2, measuring approximately 3.1 x 5.3 x 5.2 cm. This is nonspecific, worrisome for underlying neoplasm. A follow-up pelvic MRI with IV contrast is recommended for further characterization. Electronically Signed   By: Rance Burrows M.D.   On: 05/12/2024 13:53   VAS US  LOWER EXTREMITY VENOUS (DVT) (7a-7p) Result Date: 05/12/2024  Lower Venous DVT Study Patient Name:  ALDIE SOISSON  Date of Exam:   05/12/2024 Medical Rec #: [id number]         Accession #:    [id number] Date of Birth: [birth year]          Patient Gender: M Patient Age:   77 years Exam Location:  [facility] Procedure:      VAS US  LOWER EXTREMITY VENOUS (DVT) Referring Phys: Russella Courts --------------------------------------------------------------------------------  Indications: Swelling.  Risk Factors: None identified. Limitations: Poor ultrasound/tissue interface. Comparison Study: No prior studies. Performing Technologist: Lerry Ransom RVT  Examination Guidelines: A complete evaluation includes B-mode imaging, spectral Doppler, color Doppler, and power Doppler as needed of all accessible portions of each vessel. Bilateral testing is considered an integral part of a complete examination. Limited examinations for reoccurring indications may be performed as noted.  The reflux portion of the exam is performed with the patient in reverse Trendelenburg.  +-----+---------------+---------+-----------+----------+--------------+  RIGHTCompressibilityPhasicitySpontaneityPropertiesThrombus Aging +-----+---------------+---------+-----------+----------+--------------+ CFV  Full           Yes      Yes                                 +-----+---------------+---------+-----------+----------+--------------+   +---------+---------------+---------+-----------+----------+-------------------+ LEFT     CompressibilityPhasicitySpontaneityPropertiesThrombus Aging      +---------+---------------+---------+-----------+----------+-------------------+ CFV      Full           Yes      Yes                                      +---------+---------------+---------+-----------+----------+-------------------+ SFJ      Full                                                             +---------+---------------+---------+-----------+----------+-------------------+ FV Prox  Full                                                             +---------+---------------+---------+-----------+----------+-------------------+ FV Mid                  Yes      Yes                                      +---------+---------------+---------+-----------+----------+-------------------+ FV Distal               Yes      Yes                                      +---------+---------------+---------+-----------+----------+-------------------+ PFV      Full                                                             +---------+---------------+---------+-----------+----------+-------------------+ POP      Full           Yes      Yes                                      +---------+---------------+---------+-----------+----------+-------------------+ PTV      Full                                                             +---------+---------------+---------+-----------+----------+-------------------+  PERO                                                  Not well visualized  +---------+---------------+---------+-----------+----------+-------------------+    Summary: RIGHT: - No evidence of common femoral vein obstruction.   LEFT: - There is no evidence of deep vein thrombosis in the lower extremity. However, portions of this examination were limited- see technologist comments above.  - No cystic structure found in the popliteal fossa.  *See table(s) above for measurements and observations. Electronically signed by Jimmye Moulds MD on 05/12/2024 at 10:25:20 AM.    Final     Pertinent labs & imaging results that were available during my care of the patient were reviewed by me and considered in my medical decision making (see MDM for details).  Medications Ordered in ED Medications  iohexol (OMNIPAQUE) 350 MG/ML injection 125 mL (125 mLs Intravenous Contrast Given 05/12/24 1214)                                                                                                                                     Procedures Procedures  (including critical care time)  Medical Decision Making / ED Course    Medical Decision Making:    Carlos Sullivan is a 53 y.o. male with past medical history as below, significant for HLD, HTN who presents to the ED with complaint of leg swelling. The complaint involves an extensive differential diagnosis and also carries with it a high risk of complications and morbidity.  Serious etiology was considered. Ddx includes but is not limited to: DVT, lymphedema, cellulitis, thyroid disturbance, CHF, etc.  Complete initial physical exam performed, notably the patient was in no distress, no hypoxia, gait steady.    Reviewed and confirmed nursing documentation for past medical history, family history, social history.  Vital signs reviewed.     Brief summary:  53 year old male history above here with left leg swelling. No respiratory complaints.  No chest pain or palpitations.  No dyspnea. Will check screening labs, get lower extremity  duplex   Clinical Course as of 05/12/24 1525  Wed May 12, 2024  1456 CT with ?proctocolitis also concern for possible neoplasm to sacral spine  [SG]    Clinical Course User Index [SG] Teddi Favors, DO    Neuro exam is nonfocal Will get MRI with and without, handoff to incoming EDP pending MRI and recheck               Additional history obtained: -Additional history obtained from na -External records from outside source obtained and reviewed including: Chart review including previous notes, labs, imaging, consultation notes including  pdmp   Lab Tests: -I ordered, reviewed, and interpreted labs.   The pertinent results include:   Labs  Reviewed  BASIC METABOLIC PANEL WITH GFR - Abnormal; Notable for the following components:      Result Value   Calcium 8.6 (*)    All other components within normal limits  TSH  CBC WITH DIFFERENTIAL/PLATELET    Notable for labs stable  EKG   EKG Interpretation Date/Time:    Ventricular Rate:    PR Interval:    QRS Duration:    QT Interval:    QTC Calculation:   R Axis:      Text Interpretation:           Imaging Studies ordered: I ordered imaging studies including LE duplex, CT angio runoff, MRI pelvis/lumbar spine (MRI PENDING) I independently visualized the following imaging with scope of interpretation limited to determining acute life threatening conditions related to emergency care; findings noted above I agree with the radiologist interpretation If any imaging was obtained with contrast I closely monitored patient for any possible adverse reaction a/w contrast administration in the emergency department   Medicines ordered and prescription drug management: Meds ordered this encounter  Medications   iohexol (OMNIPAQUE) 350 MG/ML injection 125 mL    -I have reviewed the patients home medicines and have made adjustments as needed   Consultations Obtained: na   Cardiac Monitoring: Continuous pulse  oximetry interpreted by myself, 100% on RA.    Social Determinants of Health:  Diagnosis or treatment significantly limited by social determinants of health: current smoker   Reevaluation: After the interventions noted above, I reevaluated the patient and found that they have improved  Co morbidities that complicate the patient evaluation  Past Medical History:  Diagnosis Date   Cancer (HCC)    Hyperlipemia    Hypertension       Dispostion: Disposition decision including need for hospitalization was considered, and patient disposition pending at time of sign out.    Final Clinical Impression(s) / ED Diagnoses Final diagnoses:  None        Teddi Favors, DO 05/12/24 1525

## 2024-05-12 NOTE — Discharge Instructions (Signed)
 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 Please return immediately to the emergency department. You were given one dose of blood thinner in the emergency department.   You are electing to leave against medical advice. You have accepted the risks including worsening of your condition up to and including death.

## 2024-05-12 NOTE — ED Notes (Signed)
 Pt is at MRI

## 2024-05-12 NOTE — Progress Notes (Signed)
 Left lower extremity venous duplex has been completed. Preliminary results can be found in CV Proc through chart review.  Results were given to Dr. Martina Sledge.  05/12/24 9:40 AM Birda Buffy RVT

## 2024-05-12 NOTE — ED Triage Notes (Signed)
 Patient said his left leg is 3 times as big as his right leg. Saw his PCP yesterday and was told he needs an ultrasound. Ambulatory. No history of blood clots.

## 2024-05-12 NOTE — ED Provider Notes (Signed)
 3:13 PM Assumed care of patient from off-going team. For more details, please see note from same day.  In brief, this is a 53 y.o.  male who presents with L leg swelling. 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     Plan/Dispo at time of sign-out & ED Course since sign-out: [ ]  pelvic MRI w/ IV contrast  BP (!) 151/92   Pulse (!) 104   Temp (!) 97.4 F (36.3 C) (Oral)   Resp 16   Ht 6\' 3"  (1.905 m)   Wt 98 kg   SpO2 100%   BMI 27.00 kg/m    ED Course:   Clinical Course as of 05/12/24 2201  Wed May 12, 2024  1456 CT with ?proctocolitis also concern for possible neoplasm to sacral spine  [SG]  1553 Discussed at length with patient about his CT results. Will plan for pelvic/lumbar MRI w/ contrast. Patient discussed with his officer at halfway house about increased length of stay.  [HN]  2139 MR PELVIS W WO CONTRAST 1. 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 The spinal canal is markedly widened and tumor extends out through the sacral neural foramen. There is also involvement of all of the bony sacral segments with tumor and lumbar spine lesions are also noted. However, there are no destructive bony changes or obvious cortical erosion. There is also enhancing presacral tumor at the same levels. No associated lymphadenopathy but lymphoma is certainly a possibility. 2. The left common femoral vein, external iliac vein and left femoral vein are thrombosed. Associated surrounding inflammatory changes. This likely accounts for the patient's massive subcutaneous edema involving the left hip and thigh region.   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  Large soft tissue mass along the dorsal aspect of the sacrum extending from the superior margin of S1 to S3. Associated bone marrow signal abnormality and enhancement of the visualized S1 through S3 vertebra  concerning for additional sites of osseous metastatic disease.  Soft tissue mass extends into the visualized S1 sacral foramina and appears to envelop the bilateral S1 nerve roots.  Partially visualized presacral edema and enhancement.  Degenerative changes of the lumbar spine as above. No high-grade spinal canal stenosis. Lateral recess narrowing at multiple levels. Disc bulge at L3-4 likely results in impingement of the traversing right L4 nerve roots.  Multilevel foraminal stenosis, greatest and moderate bilaterally at L3-4 and L5-S1. Disc bulges at L3-4 and L5-S1 likely abut the exiting L3 and L5 nerve roots respectively.   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  Discussed these results with patient who calmly reports understanding.  Unfortunately, patient states that he needs to leave AGAINST MEDICAL ADVICE.  He was recently released from prison and states that he needs to contact his halfway house every 30 minutes, and his phone is about to die.  I offered to contact him myself, charge his phone, or offered the room phone to help him contact his officer.  Patient states that he will need to leave and go to the halfway house but can return first thing in the morning with another pass.  I told him that I would like to consult with neurosurgery and likely oncology as  well to figure out next steps and possible IV anticoagulation and admission to the hospital.  Patient states that he will come back to the ER in the morning.  I discussed with the patient risks of leaving including worsening of his condition up to and including death and patient will be leaving AGAINST MEDICAL ADVICE.  Will give patient a single dose of Lovenox subcu here to cover him until tomorrow for his thromboses. [HN]    Clinical  Course User Index [HN] Merdis Stalling, MD [SG] Teddi Favors, DO    Dispo: AMA ------------------------------- Annita Kindle, MD Emergency Medicine  This note was created using dictation software, which may contain spelling or grammatical errors.   Merdis Stalling, MD 05/12/24 2202

## 2024-05-13 ENCOUNTER — Encounter (HOSPITAL_COMMUNITY): Payer: Self-pay

## 2024-05-13 ENCOUNTER — Other Ambulatory Visit: Payer: Self-pay

## 2024-05-13 ENCOUNTER — Inpatient Hospital Stay (HOSPITAL_COMMUNITY)
Admission: EM | Admit: 2024-05-13 | Discharge: 2024-05-15 | DRG: 478 | Disposition: A | Discharge status: Home / Self Care | Payer: MEDICAID | Attending: Family Medicine | Admitting: Family Medicine

## 2024-05-13 ENCOUNTER — Inpatient Hospital Stay (HOSPITAL_COMMUNITY): Payer: Self-pay

## 2024-05-13 ENCOUNTER — Inpatient Hospital Stay (HOSPITAL_COMMUNITY): Payer: MEDICAID

## 2024-05-13 DIAGNOSIS — R609 Edema, unspecified: Secondary | ICD-10-CM

## 2024-05-13 DIAGNOSIS — Z6827 Body mass index (BMI) 27.0-27.9, adult: Secondary | ICD-10-CM

## 2024-05-13 DIAGNOSIS — Z9049 Acquired absence of other specified parts of digestive tract: Secondary | ICD-10-CM

## 2024-05-13 DIAGNOSIS — Z5982 Transportation insecurity: Secondary | ICD-10-CM

## 2024-05-13 DIAGNOSIS — C799 Secondary malignant neoplasm of unspecified site: Secondary | ICD-10-CM

## 2024-05-13 DIAGNOSIS — C7951 Secondary malignant neoplasm of bone: Principal | ICD-10-CM | POA: Diagnosis present

## 2024-05-13 DIAGNOSIS — F1120 Opioid dependence, uncomplicated: Secondary | ICD-10-CM | POA: Diagnosis present

## 2024-05-13 DIAGNOSIS — F17200 Nicotine dependence, unspecified, uncomplicated: Secondary | ICD-10-CM | POA: Diagnosis present

## 2024-05-13 DIAGNOSIS — G8929 Other chronic pain: Secondary | ICD-10-CM | POA: Diagnosis present

## 2024-05-13 DIAGNOSIS — M533 Sacrococcygeal disorders, not elsewhere classified: Secondary | ICD-10-CM | POA: Diagnosis present

## 2024-05-13 DIAGNOSIS — Z79899 Other long term (current) drug therapy: Secondary | ICD-10-CM

## 2024-05-13 DIAGNOSIS — E871 Hypo-osmolality and hyponatremia: Secondary | ICD-10-CM | POA: Diagnosis present

## 2024-05-13 DIAGNOSIS — D649 Anemia, unspecified: Secondary | ICD-10-CM | POA: Diagnosis present

## 2024-05-13 DIAGNOSIS — M4800 Spinal stenosis, site unspecified: Secondary | ICD-10-CM | POA: Diagnosis present

## 2024-05-13 DIAGNOSIS — I10 Essential (primary) hypertension: Secondary | ICD-10-CM | POA: Diagnosis present

## 2024-05-13 DIAGNOSIS — M47816 Spondylosis without myelopathy or radiculopathy, lumbar region: Secondary | ICD-10-CM | POA: Diagnosis present

## 2024-05-13 DIAGNOSIS — E663 Overweight: Secondary | ICD-10-CM | POA: Diagnosis present

## 2024-05-13 DIAGNOSIS — C269 Malignant neoplasm of ill-defined sites within the digestive system: Secondary | ICD-10-CM | POA: Diagnosis present

## 2024-05-13 DIAGNOSIS — E785 Hyperlipidemia, unspecified: Secondary | ICD-10-CM | POA: Diagnosis present

## 2024-05-13 DIAGNOSIS — Z5941 Food insecurity: Secondary | ICD-10-CM

## 2024-05-13 DIAGNOSIS — Z87892 Personal history of anaphylaxis: Secondary | ICD-10-CM

## 2024-05-13 DIAGNOSIS — I82412 Acute embolism and thrombosis of left femoral vein: Principal | ICD-10-CM | POA: Diagnosis present

## 2024-05-13 DIAGNOSIS — C787 Secondary malignant neoplasm of liver and intrahepatic bile duct: Secondary | ICD-10-CM | POA: Diagnosis present

## 2024-05-13 DIAGNOSIS — I82422 Acute embolism and thrombosis of left iliac vein: Secondary | ICD-10-CM | POA: Diagnosis present

## 2024-05-13 DIAGNOSIS — Z91030 Bee allergy status: Secondary | ICD-10-CM

## 2024-05-13 LAB — CBC WITH DIFFERENTIAL/PLATELET
Abs Immature Granulocytes: 0 10*3/uL (ref 0.00–0.07)
Basophils Absolute: 0 10*3/uL (ref 0.0–0.1)
Basophils Relative: 1 %
Eosinophils Absolute: 0.1 10*3/uL (ref 0.0–0.5)
Eosinophils Relative: 3 %
HCT: 31.4 % — ABNORMAL LOW (ref 39.0–52.0)
Hemoglobin: 10.3 g/dL — ABNORMAL LOW (ref 13.0–17.0)
Immature Granulocytes: 0 %
Lymphocytes Relative: 28 %
Lymphs Abs: 1.1 10*3/uL (ref 0.7–4.0)
MCH: 29 pg (ref 26.0–34.0)
MCHC: 32.8 g/dL (ref 30.0–36.0)
MCV: 88.5 fL (ref 80.0–100.0)
Monocytes Absolute: 0.4 10*3/uL (ref 0.1–1.0)
Monocytes Relative: 11 %
Neutro Abs: 2.2 10*3/uL (ref 1.7–7.7)
Neutrophils Relative %: 57 %
Platelets: 129 10*3/uL — ABNORMAL LOW (ref 150–400)
RBC: 3.55 MIL/uL — ABNORMAL LOW (ref 4.22–5.81)
RDW: 14.1 % (ref 11.5–15.5)
WBC: 3.9 10*3/uL — ABNORMAL LOW (ref 4.0–10.5)
nRBC: 0 % (ref 0.0–0.2)

## 2024-05-13 LAB — BASIC METABOLIC PANEL WITH GFR
Anion gap: 7 (ref 5–15)
BUN: 9 mg/dL (ref 6–20)
CO2: 25 mmol/L (ref 22–32)
Calcium: 8.9 mg/dL (ref 8.9–10.3)
Chloride: 106 mmol/L (ref 98–111)
Creatinine, Ser: 0.68 mg/dL (ref 0.61–1.24)
GFR, Estimated: >60 mL/min (ref >60)
Glucose, Bld: 95 mg/dL (ref 70–99)
Potassium: 3.6 mmol/L (ref 3.5–5.1)
Sodium: 138 mmol/L (ref 135–145)

## 2024-05-13 LAB — HIV ANTIBODY (ROUTINE TESTING W REFLEX): HIV Screen 4th Generation wRfx: NONREACTIVE

## 2024-05-13 LAB — PROTIME-INR
INR: 1.2 (ref 0.8–1.2)
Prothrombin Time: 14.9 s (ref 11.4–15.2)

## 2024-05-13 LAB — HEPARIN LEVEL (UNFRACTIONATED): Heparin Unfractionated: 0.3 [IU]/mL (ref 0.30–0.70)

## 2024-05-13 MED ORDER — ACETAMINOPHEN 325 MG PO TABS
650.0000 mg | ORAL_TABLET | Freq: Four times a day (QID) | ORAL | Status: DC | PRN
  Administered 2024-05-13 – 2024-05-15 (×3): 650 mg via ORAL
  Filled 2024-05-13 (×3): qty 2

## 2024-05-13 MED ORDER — ONDANSETRON HCL 4 MG PO TABS: 4.0000 mg | ORAL_TABLET | Freq: Four times a day (QID) | ORAL | Status: DC | PRN

## 2024-05-13 MED ORDER — HEPARIN (PORCINE) 25000 UT/250ML-% IV SOLN
1750.0000 [IU]/h | INTRAVENOUS | Status: AC
  Administered 2024-05-13: 1700 [IU]/h via INTRAVENOUS
  Administered 2024-05-14: 1750 [IU]/h via INTRAVENOUS
  Filled 2024-05-13 (×2): qty 250

## 2024-05-13 MED ORDER — IOHEXOL 300 MG/ML  SOLN
100.0000 mL | Freq: Once | INTRAMUSCULAR | Status: AC | PRN
  Administered 2024-05-13: 100 mL via INTRAVENOUS

## 2024-05-13 MED ORDER — ONDANSETRON HCL 4 MG/2ML IJ SOLN: 4.0000 mg | Freq: Four times a day (QID) | INTRAMUSCULAR | Status: DC | PRN

## 2024-05-13 MED ORDER — BUPRENORPHINE HCL-NALOXONE HCL 8-2 MG SL SUBL
1.0000 | SUBLINGUAL_TABLET | Freq: Two times a day (BID) | SUBLINGUAL | Status: DC
  Administered 2024-05-13: 1 via SUBLINGUAL
  Filled 2024-05-13: qty 1

## 2024-05-13 MED ORDER — ACETAMINOPHEN 650 MG RE SUPP: 650.0000 mg | Freq: Four times a day (QID) | RECTAL | Status: DC | PRN

## 2024-05-13 NOTE — H&P (Addendum)
 History and Physical    [name]:[id number] DOB: [birth year] DOA: 05/13/2024  I have briefly reviewed the patient's prior medical records in [name]  PCP: Patient, [name]  Patient coming from: Halfway house  Chief Complaint: Leg swelling, came to the ER x 2 and left AMA each time  HPI: Carlos Sullivan is a 53 y.o. male with medical history significant of umbilical hernia repair and questionable colon cancer/lesion in 2018 at [facility].  Patient was a prisoner at that time so unable to access records in Care Everywhere but he reports they also remove part of his small bowel due to lesion on his intestines.  He reports that the lesion was nonmalignant/slow growing. Patient initially reported to the ER on 5/13 for left leg swelling worsened over the last 3 weeks but appears he left before being seen.  Patient then came back to the ER on 5/14 where he had an ultrasound of his leg that was negative for DVTs.  He also had CT BioFem that showed a tumor in his spinal canal.  ER plan for a pelvic/lumbar MRI that showed extensive tumor occupying the lower spinal canal.  The spinal canal was markedly widened and tumor extends out through the sacral foramen.  Patient also had no associated lymphadenopathy.  CT scan showed left common femoral vein and iliac vein as well as left femoral vein were thrombosed.  He then again left AGAINST MEDICAL ADVICE due to needing to report back to his halfway house.  He was given a dose of Lovenox.  Patient comes back to the ER on 5/15 where the ER reached out to both neurosurgery and oncology.  Neurosurgery recommends a CT chest abdomen pelvis for metastatic workup and IR guided biopsy for further treatment.  They recommend patient to remain at [facility] for oncology evaluation.  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   Past Medical History:  Diagnosis Date   Cancer (HCC)    Hyperlipemia    Hypertension     Past Surgical History:  Procedure Laterality Date   CHOLECYSTECTOMY     HERNIA REPAIR       reports that he has been smoking. He does not have any smokeless tobacco history on file. He reports that he does not drink alcohol and does not use drugs.  Allergies  Allergen Reactions   Bee Venom Anaphylaxis    History reviewed. No pertinent family history.  Prior to Admission medications   Medication Sig Start Date End Date Taking? Authorizing Provider  Buprenorphine HCl-Naloxone HCl 8-2 MG FILM Place 1 Film under the tongue in the morning, at noon, and at bedtime. 04/24/24  Yes [provider]  ibuprofen  (ADVIL ) 800 MG tablet Take 800 mg by mouth in the morning and at bedtime.   Yes [provider]  losartan (COZAAR) 25 MG tablet Take 25 mg by mouth daily.   Yes [provider]    Physical Exam: Vitals:   05/13/24 0945 05/13/24 1000 05/13/24 1015 05/13/24 1030  BP: 127/88 115/78 117/79 116/83  Pulse: 67 62 63 (!) 55  Resp: 16   16  Temp: 98.2 F (36.8 C)     TempSrc: Oral     SpO2: 100% 98% 100% 98%  Weight:  Height:          Constitutional: NAD, calm, comfortable Eyes: PERRL, lids and conjunctivae normal ENMT: Mucous membranes are moist. Posterior pharynx clear of any exudate or lesions.Normal dentition.  Neck: normal, supple, no masses, no thyromegaly Respiratory: clear to auscultation bilaterally, no wheezing, no crackles. Normal respiratory effort. No accessory muscle use.  Cardiovascular: Regular rate and rhythm, no murmurs / rubs / gallop Abdomen: no tenderness, no masses palpated. Bowel sounds positive.  Musculoskeletal: no clubbing / cyanosis. Normal muscle tone.  Skin: Markedly swollen left leg Neurologic: CN 2-12 grossly intact. Strength 5/5 in all 4.  Psychiatric: Normal judgment and  insight. Alert and oriented x 3. Normal mood.   Labs on Admission: I have personally reviewed following labs and imaging studies  CBC: Recent Labs  Lab 05/12/24 1154 05/13/24 0945  WBC 5.1 3.9*  NEUTROABS 2.9 2.2  HGB 10.2* 10.3*  HCT 31.7* 31.4*  MCV 89.5 88.5  PLT 126* 129*   Basic Metabolic Panel: Recent Labs  Lab 05/12/24 0943 05/13/24 0945  NA 139 138  K 3.8 3.6  CL 109 106  CO2 24 25  GLUCOSE 82 95  BUN 9 9  CREATININE 0.72 0.68  CALCIUM 8.6* 8.9   GFR: Estimated Creatinine Clearance: 132 mL/min (by C-G formula based on SCr of 0.68 mg/dL). Liver Function Tests: No results for input(s): "AST", "ALT", "ALKPHOS", "BILITOT", "PROT", "ALBUMIN" in the last 168 hours. No results for input(s): "LIPASE", "AMYLASE" in the last 168 hours. No results for input(s): "AMMONIA" in the last 168 hours. Coagulation Profile: Recent Labs  Lab 05/13/24 0945  INR 1.2   Cardiac Enzymes: No results for input(s): "CKTOTAL", "CKMB", "CKMBINDEX", "TROPONINI" in the last 168 hours. BNP (last 3 results) No results for input(s): "PROBNP" in the last 8760 hours. HbA1C: No results for input(s): "HGBA1C" in the last 72 hours. CBG: No results for input(s): "GLUCAP" in the last 168 hours. Lipid Profile: No results for input(s): "CHOL", "HDL", "LDLCALC", "TRIG", "CHOLHDL", "LDLDIRECT" in the last 72 hours. Thyroid Function Tests: Recent Labs    05/12/24 0943  TSH 0.886   Anemia Panel: No results for input(s): "VITAMINB12", "FOLATE", "FERRITIN", "TIBC", "IRON", "RETICCTPCT" in the last 72 hours. Urine analysis: No results found for: "COLORURINE", "APPEARANCEUR", "LABSPEC", "PHURINE", "GLUCOSEU", "HGBUR", "BILIRUBINUR", "KETONESUR", "PROTEINUR", "UROBILINOGEN", "NITRITE", "LEUKOCYTESUR"   Radiological Exams on Admission: MR PELVIS W WO CONTRAST Result Date: 05/12/2024 CLINICAL DATA:  Sacral mass seen on recent CT scan and lumbar spine MRI. EXAM: MRI PELVIS WITHOUT AND WITH CONTRAST  TECHNIQUE: Multiplanar multisequence MR imaging of the pelvis was performed both before and after administration of intravenous contrast. CONTRAST:  10mL GADAVIST GADOBUTROL 1 MMOL/ML IV SOLN COMPARISON:  CT scan 05/12/2024 and MRI lumbar spine 05/12/2024 FINDINGS: Urinary Tract: The bladder is unremarkable. No bladder mass or calculi. Bowel: Moderate pericolonic inflammatory changes but no rectal or sigmoid colon mass is identified. The visualized pelvic small bowel loops are unremarkable. Vascular/Lymphatic: The left common femoral vein, external iliac vein and left femoral vein are thrombosed. Associated surrounding inflammatory changes. This likely accounts for the patient's massive subcutaneous edema involving the left hip and thigh region. No overt lymphadenopathy the. Reproductive:  The prostate gland seminal vesicles are unremarkable. Other: Extensive tumor occupying the spinal canal as noted on the recent MRI lumbar spine. The tumor begins at the L5-S1 disc space and extends down to the bottom of S3. The spinal canal is markedly widened and tumor extends out through the sacral neural foramen. There  is also involvement of all of the bony sacral segments with tumor and lumbar spine lesions are also noted. However, there is no destructive bony changes or obvious cortical erosion. There is also enhancing presacral tumor at the same levels. No associated lymphadenopathy but lymphoma is certainly a possibility. Musculoskeletal: Infiltrating lumbar and sacral lesions without destructive cortical changes possibly reflecting a process such as lymphoma. IMPRESSION: 1. 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 The spinal canal is markedly widened and tumor extends out through the sacral neural foramen. There is also involvement of all of the bony sacral segments with tumor and lumbar spine lesions are also noted. However, there are no destructive bony changes or obvious cortical erosion. There is also  enhancing presacral tumor at the same levels. No associated lymphadenopathy but lymphoma is certainly a possibility. 2. The left common femoral vein, external iliac vein and left femoral vein are thrombosed. Associated surrounding inflammatory changes. This likely accounts for the patient's massive subcutaneous edema involving the left hip and thigh region. Electronically Signed   By: Marrian Siva M.D.   On: 05/12/2024 21:36   MR Lumbar Spine W Wo Contrast Result Date: 05/12/2024 CLINICAL DATA:  Metastatic disease evaluation, lower back pain radiating into the left glute muscles. Left leg swelling. EXAM: MRI LUMBAR SPINE WITHOUT AND WITH CONTRAST TECHNIQUE: Multiplanar and multiecho pulse sequences of the lumbar spine were obtained without and with intravenous contrast. CONTRAST:  10mL GADAVIST GADOBUTROL 1 MMOL/ML IV SOLN COMPARISON:  None Available. FINDINGS: Segmentation:  Standard. Alignment: Lumbar lordosis is maintained. Grade 1 anterolisthesis of L5 on S1. Vertebrae: There is a T1 hypointense, stir hyperintense lesion in the right posterior aspect of T12 which demonstrates mild enhancement. Lesion partially extends into the right pedicle of T12. Additional similar appearing lesion within the right anterior inferior aspect of the L5 vertebra. Irregularity of the L4 superior endplate with associated mild edema and enhancement likely reflecting Schmorl's nodes. Additional Modic type 1 degenerative endplate changes anteriorly at L3-4 with mild associated discogenic edema. Additional remote Schmorl's nodes in the lower thoracic and upper lumbar spine. Vertebral body heights otherwise maintained. There is a mildly enhancing STIR hyperintense mass along the dorsal aspect of the sacrum which measures 8.1 x 2.6 cm in maximum dimensions on sagittal images. There is additional signal abnormality within the visualized S1-S3 sacral vertebra with associated enhancement. Conus medullaris and cauda equina: Conus extends  to the L1-2 level. Conus and cauda equina appear normal. Paraspinal and other soft tissues: There is presacral edema and enhancement which is partially visualized. The paraspinal musculature is otherwise unremarkable. Disc levels: T12-L1: Minimal disc bulge. Facet arthrosis indents the dorsal thecal sac. No significant spinal canal stenosis. No significant foraminal stenosis. L1-2: Mild facet arthrosis indents the dorsal thecal sac. No significant spinal canal stenosis. No significant foraminal stenosis. L2-3: Minimal disc bulge which indents the ventral thecal sac with lateral recess narrowing without impingement upon the traversing nerve roots. Moderate facet arthrosis indents the dorsal thecal sac. Mild spinal canal stenosis. There is mild foraminal stenosis on the right. L3-4: Disc desiccation and moderate disc height loss. Diffuse disc bulge eccentric to the right which indents the ventral thecal sac with lateral recess narrowing more pronounced on the right. There is likely impingement upon the traversing right L4 nerve roots. Additional right paracentral component of disc extrusion with approximately 4 mm cranial migration of disc contents. Mild facet arthrosis indents the dorsal thecal sac. Mild spinal canal stenosis. There is moderate bilateral foraminal stenosis.  Disc bulge likely contacts the exiting bilateral L3 nerve roots. L4-5: Small disc bulge resulting in lateral recess narrowing without significant impingement upon the traversing nerve roots. Moderate to severe facet arthrosis which indents the dorsal thecal sac. Mild spinal canal stenosis. There is mild foraminal stenosis on the left. L5-S1: Grade 1 anterolisthesis with partial uncovering of the disc. Diffuse disc bulge indents the ventral thecal sac with lateral recess narrowing without significant impingement upon the traversing nerve roots. Severe facet arthrosis indents the dorsal thecal sac. There is thickening of the ligamentum flavum which  appears edematous with associated enhancement. Mild-to-moderate spinal canal stenosis. There is moderate bilateral foraminal stenosis. Disc bulge abuts the bilateral L5 nerve roots. Mass along the dorsal aspect of the sacrum abuts the ventral and inferior aspect of the thecal sac just below the level of the L5-S1 disc without evidence of extension into the thecal sac. The mass extends into the bilateral S1 sacral foramina in appears to displace the sacral nerve roots superiorly within the foramina. The tumor appears to envelop the nerve roots. Enhancement along the visualized sacral nerve roots concerning for perineural spread. 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 Large soft tissue mass along the dorsal aspect of the sacrum extending from the superior margin of S1 to S3. Associated bone marrow signal abnormality and enhancement of the visualized S1 through S3 vertebra concerning for additional sites of osseous metastatic disease. Soft tissue mass extends into the visualized S1 sacral foramina and appears to envelop the bilateral S1 nerve roots. Partially visualized presacral edema and enhancement. Degenerative changes of the lumbar spine as above. No high-grade spinal canal stenosis. Lateral recess narrowing at multiple levels. Disc bulge at L3-4 likely results in impingement of the traversing right L4 nerve roots. Multilevel foraminal stenosis, greatest and moderate bilaterally at L3-4 and L5-S1. Disc bulges at L3-4 and L5-S1 likely abut the exiting L3 and L5 nerve roots respectively. Electronically Signed   By: Denny Flack M.D.   On: 05/12/2024 20:25   CT ANGIO AO+BIFEM W & OR WO CONTRAST Result Date: 05/12/2024 CLINICAL DATA:  Left leg swelling, ?phlegmasia EXAM: CT ANGIOGRAPHY CHEST WITH CONTRAST TECHNIQUE: Multidetector CT imaging of the abdomen and pelvis with run-off to both lower extremities was performed using the standard protocol during  bolus administration of intravenous contrast. Multiplanar CT image reconstructions and MIPs were obtained to evaluate the vascular anatomy. RADIATION DOSE REDUCTION: This exam was performed according to the departmental dose-optimization program which includes automated exposure control, adjustment of the mA and/or kV according to patient size and/or use of iterative reconstruction technique. CONTRAST:  125mL OMNIPAQUE IOHEXOL 350 MG/ML SOLN COMPARISON:  None Available. FINDINGS: VASCULAR Aorta: No aneurysm or aortic dissection. No acute thrombus. Celiac: Patent without aneurysm, dissection, or hemodynamically significant stenosis. SMA: Patent without aneurysm, dissection, or hemodynamically significant stenosis. Renals: Patent without aneurysm, dissection, or hemodynamically significant stenosis. IMA: Patent without aneurysm, dissection, or hemodynamically significant stenosis. RIGHT Lower Extremity Inflow: Common, internal and external iliac arteries are patent without aneurysm, acute thrombus, or dissection. No plaque or hemodynamically significant stenosis. Outflow: Common, superficial and profunda femoral, and the popliteal arteries are patent without acute thrombus, aneurysm, or dissection. No plaque or hemodynamically significant stenosis. Runoff: Patent three vessel runoff to the foot. LEFT Lower Extremity Inflow: Common, internal and external iliac arteries are patent without aneurysm, acute thrombus, or dissection. No plaque or hemodynamically significant stenosis. Outflow: Common, superficial and profunda femoral, and the popliteal arteries  are patent without acute thrombus, aneurysm, or dissection. No plaque or hemodynamically significant stenosis. Runoff: Patent three vessel runoff to the foot. Veins: Not evaluated due to the phase of contrast. Review of the MIP images confirms the above findings. NON-VASCULAR Lower chest: No focal airspace consolidation or pleural effusion. Hepatobiliary: No  mass.Cholecystectomy.No intrahepatic or extrahepatic biliary ductal dilation. Pancreas: No mass or main ductal dilation.No peripancreatic inflammation or fluid collection. Spleen: Normal size. No mass. Adrenals/Urinary Tract: No adrenal masses. No renal mass. No hydronephrosis or nephrolithiasis. Circumferential wall thickening of the urinary bladder. Stomach/Bowel: The stomach is decompressed without focal abnormality. No small bowel wall thickening or inflammation. No small bowel obstruction. The appendix was not visualized. No right lower quadrant or pericecal inflammatory changes to suggest acute appendicitis. Mucosal hyperenhancement and mild wall thickening in the rectosigmoid colon. Presacral stranding also present. Vascular/Lymphatic: Mildly enlarged left external iliac chain lymph node measuring 1.3 cm (axial 219). Reproductive: No prostatomegaly.no free pelvic fluid. Other: No pneumoperitoneum, ascites, or mesenteric inflammation. Musculoskeletal: No acute fracture. Soft tissue mass within the spinal canal causing bony expansion and overlying destruction of the sacrum, centered at S2, measuring approximately 3.1 x 5.3 x 5.2 cm (axial 196). This causes significant narrowing of the S2 neural foramina.Moderate diffuse subcutaneous edema throughout the left leg with diffuse skin thickening present. Multilevel degenerative disc disease of the spine. IMPRESSION: VASCULAR 1. No aortic aneurysm or aortic dissection. 2. No acute thrombus or hemodynamically significant stenosis within the inflow or outflow vessels to either leg. Patent three-vessel runoff to the feet bilaterally. NON-VASCULAR 1. Mucosal hyperenhancement and mild wall thickening of the rectosigmoid colon with presacral stranding, which may reflect changes of an infectious or inflammatory proctocolitis. 2. Soft tissue mass within the spinal canal causing bony expansion and overlying destruction of the sacral bone, centered at S2, measuring  approximately 3.1 x 5.3 x 5.2 cm. This is nonspecific, worrisome for underlying neoplasm. A follow-up pelvic MRI with IV contrast is recommended for further characterization. Electronically Signed   By: Rance Burrows M.D.   On: 05/12/2024 13:53   VAS US  LOWER EXTREMITY VENOUS (DVT) (7a-7p) Result Date: 05/12/2024  Lower Venous DVT Study Patient Name:  SHAHRUKH HORROCKS  Date of Exam:   05/12/2024 Medical Rec #: [id number]         Accession #:    [id number] Date of Birth: 03-30-1971          Patient Gender: M Patient Age:   84 years Exam Location:  [facility] Procedure:      VAS US  LOWER EXTREMITY VENOUS (DVT) Referring Phys: Russella Courts --------------------------------------------------------------------------------  Indications: Swelling.  Risk Factors: None identified. Limitations: Poor ultrasound/tissue interface. Comparison Study: No prior studies. Performing Technologist: Lerry Ransom RVT  Examination Guidelines: A complete evaluation includes B-mode imaging, spectral Doppler, color Doppler, and power Doppler as needed of all accessible portions of each vessel. Bilateral testing is considered an integral part of a complete examination. Limited examinations for reoccurring indications may be performed as noted. The reflux portion of the exam is performed with the patient in reverse Trendelenburg.  +-----+---------------+---------+-----------+----------+--------------+ RIGHTCompressibilityPhasicitySpontaneityPropertiesThrombus Aging +-----+---------------+---------+-----------+----------+--------------+ CFV  Full           Yes      Yes                                 +-----+---------------+---------+-----------+----------+--------------+   +---------+---------------+---------+-----------+----------+-------------------+ LEFT     CompressibilityPhasicitySpontaneityPropertiesThrombus Aging      +---------+---------------+---------+-----------+----------+-------------------+  CFV       Full           Yes      Yes                                      +---------+---------------+---------+-----------+----------+-------------------+ SFJ      Full                                                             +---------+---------------+---------+-----------+----------+-------------------+ FV Prox  Full                                                             +---------+---------------+---------+-----------+----------+-------------------+ FV Mid                  Yes      Yes                                      +---------+---------------+---------+-----------+----------+-------------------+ FV Distal               Yes      Yes                                      +---------+---------------+---------+-----------+----------+-------------------+ PFV      Full                                                             +---------+---------------+---------+-----------+----------+-------------------+ POP      Full           Yes      Yes                                      +---------+---------------+---------+-----------+----------+-------------------+ PTV      Full                                                             +---------+---------------+---------+-----------+----------+-------------------+ PERO                                                  Not well visualized +---------+---------------+---------+-----------+----------+-------------------+    Summary: RIGHT: - No evidence of common femoral vein obstruction.   LEFT: - There is no evidence of deep vein thrombosis in the  lower extremity. However, portions of this examination were limited- see technologist comments above.  - No cystic structure found in the popliteal fossa.  *See table(s) above for measurements and observations. Electronically signed by Jimmye Moulds MD on 05/12/2024 at 10:25:20 AM.    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 Barnett Libel) -I have requested records from [facility] including pathology from his surgery in 2018  Anemia - Trend hemoglobin  Thrombosis of the left common femoral vein with lower extremity swelling - IV heparin while workups in place -Elevate extremity  Hyponatremia - Trend  DVT prophylaxis: Heparin drip Code Status: Full  Consults called: Neurosurgery (phone) IR consult placed for biopsy Oncology Dr. Priscille Brought [facility]   How to contact the [facility] Attending or Consulting provider 7A - 7P or covering provider during after hours 7P -7A, for this patient?  Check the care team in [facility] and look for a) attending/consulting [facility] provider listed and b) the [facility] team listed Log into [email or web address] and use [facility] universal password to access. If you do not have the password, please contact the hospital operator. Locate the [facility] provider you are looking for under [facility] and page to a number that you can be directly reached. If you still have difficulty reaching the provider, please page the [facility] (Director on Call) for the Hospitalists listed on amion for assistance.  05/13/2024, 11:32 AM

## 2024-05-13 NOTE — Progress Notes (Signed)
 PHARMACY - ANTICOAGULATION CONSULT NOTE  Pharmacy Consult for Heparin Indication: DVT  Allergies  Allergen Reactions   Bee Venom Anaphylaxis    Patient Measurements: Height: 6\' 3"  (190.5 cm) Weight: 98 kg (216 lb 0.8 oz) IBW/kg (Calculated) : 84.5 HEPARIN DW (KG): 98  Vital Signs: Temp: 98 F (36.7 C) (05/15 1223) Temp Source: Oral (05/15 1223) BP: 130/84 (05/15 1223) Pulse Rate: 73 (05/15 1223)  Labs: Recent Labs    05/12/24 0943 05/12/24 1154 05/13/24 0945 05/13/24 1701  HGB  --  10.2* 10.3*  --   HCT  --  31.7* 31.4*  --   PLT  --  126* 129*  --   LABPROT  --   --  14.9  --   INR  --   --  1.2  --   HEPARINUNFRC  --   --   --  0.30  CREATININE 0.72  --  0.68  --     Estimated Creatinine Clearance: 132 mL/min (by C-G formula based on SCr of 0.68 mg/dL).   Medical History: Past Medical History:  Diagnosis Date   Cancer (HCC)    Hyperlipemia    Hypertension     Medications:  Medications Prior to Admission  Medication Sig Dispense Refill Last Dose/Taking   Buprenorphine HCl-Naloxone HCl 8-2 MG FILM Place 1 Film under the tongue in the morning, at noon, and at bedtime.   05/13/2024   ibuprofen  (ADVIL ) 800 MG tablet Take 800 mg by mouth in the morning and at bedtime.   05/13/2024   losartan (COZAAR) 25 MG tablet Take 25 mg by mouth daily.   05/13/2024    Assessment: 53 y/o M  presented to ED 5/14 with L leg swelling. 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    Patient received received Lovenox 1 mg/kg [place] x 1 approximately twelve hours ago (97.5 mg given 5/14 at 2213).   Today, 05/13/24: Heparin level 0.30--low end of therapeutic range with heparin running @1700  units/hr Hgb 10.3, plts 129--low, stable No s/sx of bleeding reported Scr 0.68--stable  Goal of Therapy:  Heparin level 0.3-0.7 units/ml Monitor platelets by anticoagulation protocol: Yes   Plan:  Slightly increase  heparin gtt rate to 1750 units/hr Check confirmatory heparin level in 6 hours Monitor heparin level, CBC, and s/sx of bleeding daily Per IR, hold heparin gtt 4 hours prior to biopsy scheduled 5/16 (IR communicated to RN to hold heparin 5/16 @0800 )   Roselee Cong, PharmD Clinical Pharmacist  5/15/20255:56 PM

## 2024-05-13 NOTE — Progress Notes (Signed)
 Pt reportedly presented to ED w/ CC of LLE swelling x3 weeks. He presented yesterday and left AMA after receiving MRI results as he was recently released from prison and needed to contact his halfway house. Re-presented today w/ similar symptoms. Neuro intact per EDP. 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 Recommend CT chest/abd/pelvis for metastatic workup. Thereafter he likely needs a guided biopsy for diagnosis to guide further treatment. At this point it is appropriate for him to remain at [facility] for further oncologic workup and management.   Carlos Sullivan Carlos Jovante Hammitt, PA-C

## 2024-05-13 NOTE — Progress Notes (Signed)
 PHARMACY - ANTICOAGULATION CONSULT NOTE  Pharmacy Consult for Heparin Indication: DVT  Allergies  Allergen Reactions   Bee Venom Anaphylaxis    Patient Measurements: Height: 6\' 3"  (190.5 cm) Weight: 98 kg (216 lb 0.8 oz) IBW/kg (Calculated) : 84.5 HEPARIN DW (KG): 98  Vital Signs: Temp: 98.2 F (36.8 C) (05/15 0945) Temp Source: Oral (05/15 0945) BP: 127/88 (05/15 0945) Pulse Rate: 67 (05/15 0945)  Labs: Recent Labs    05/12/24 0943 05/12/24 1154  HGB  --  10.2*  HCT  --  31.7*  PLT  --  126*  CREATININE 0.72  --     Estimated Creatinine Clearance: 132 mL/min (by C-G formula based on SCr of 0.72 mg/dL).   Medical History: Past Medical History:  Diagnosis Date   Cancer (HCC)    Hyperlipemia    Hypertension     Medications:  (Not in a hospital admission)   Assessment: 53 y/o M  presented to ED 5/14 with L leg swelling. 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    Patient received received Lovenox 1 mg/kg [place] x 1 approximately twelve hours ago (97.5 mg given 5/14 at 2213).    Goal of Therapy:  Heparin level 0.3-0.7 units/ml Monitor platelets by anticoagulation protocol: Yes   Plan:  Begin heparin infusion at 1700 units/hr. Check heparin level in 6 hours CBC daily  Ginnie Laine, PharmD, BCPS Clinical Pharmacist 05/13/2024  10:30 AM

## 2024-05-13 NOTE — Consult Note (Signed)
 Chief Complaint: Chronic back pain, left leg swelling/venous thrombosis, sacral mass; referred for image guided biopsy of sacral mass  Referring Provider(s): Vann,J  Supervising Physician: Marland Silvas  Patient Status: [facility] - In-pt  History of Present Illness: Carlos Sullivan is a 53 y.o. male smoker with PMH sig for HLD, HTN, and questionable colon cancer 2018 at [facility] with reported bowel resection at that time who presents now with left leg swelling  as well as persistent back pain. 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   Large soft tissue mass along the dorsal aspect of the sacrum extending from the superior margin of S1 to S3. Associated bone marrow signal abnormality and enhancement of the visualized S1 through S3 vertebra concerning for additional sites of osseous metastatic disease.   Soft tissue mass extends into the visualized S1 sacral foramina and appears to envelop the bilateral S1 nerve roots.   Partially visualized presacral edema and enhancement.   Degenerative changes of the lumbar spine as above. No high-grade spinal canal stenosis. Lateral recess narrowing at multiple levels. Disc bulge at L3-4 likely results in impingement of the traversing right L4 nerve roots.   Multilevel foraminal stenosis, greatest and moderate bilaterally at L3-4 and L5-S1. Disc bulges at L3-4 and L5-S1 likely abut the exiting L3 and L5 nerve roots respectively.  MRI pelvis revealed:   1. 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 The spinal canal is markedly widened and tumor extends out through the sacral neural foramen. There is also involvement of all of the bony sacral segments with tumor and lumbar spine lesions are also noted. However, there are no destructive bony changes or obvious cortical erosion. There is also enhancing presacral tumor at the same levels. No associated  lymphadenopathy but lymphoma is certainly a possibility. 2. The left common femoral vein, external iliac vein and left femoral vein are thrombosed. Associated surrounding inflammatory changes. This likely accounts for the patient's massive subcutaneous edema involving the left hip and thigh region  CT C/A/P today revealed:  1. There are at least 2, ill-defined hypoattenuating masses in the right hepatic lobe, segment 5, which are incompletely characterized on the current exam but suspicious. Further evaluation fifth multiphasic contrast-enhanced MRI abdomen as per liver mass protocol is recommended. 2. No metastatic disease identified within the chest. 3. Redemonstration of patient's known upper sacral mass.   Request now received for image guided sacral mass biopsy for further evaluation. Pt currently on IV heparin.  Afebrile, WBC 3.9, hemoglobin 10.3, platelets 129k, PT/INR normal, creatinine normal    Patient is Full Code  Past Medical History:  Diagnosis Date   Cancer (HCC)    Hyperlipemia    Hypertension     Past Surgical History:  Procedure Laterality Date   CHOLECYSTECTOMY     HERNIA REPAIR      Allergies: Bee venom  Medications: Prior to Admission medications   Medication Sig Start Date End Date Taking? Authorizing Provider  Buprenorphine HCl-Naloxone HCl 8-2 MG FILM Place 1 Film under the tongue in the morning, at noon, and at bedtime. 04/24/24  Yes [provider]  ibuprofen  (ADVIL ) 800 MG tablet Take 800 mg by mouth in the morning and at bedtime.   Yes [provider]  losartan (COZAAR) 25 MG tablet Take 25 mg by mouth daily.   Yes [provider]     History reviewed. No pertinent family history.  Social History   Socioeconomic History   Marital status: Divorced    Spouse name: Not on file   Number of children: Not on file   Years of education: Not on file   Highest education level: Not on file  Occupational History   Not  on file  Tobacco Use   Smoking status: Every Day   Smokeless tobacco: Not on file  Substance and Sexual Activity   Alcohol use: No   Drug use: No   Sexual activity: Not on file  Other Topics Concern   Not on file  Social History Narrative   Not on file   Social Drivers of Health   Financial Resource Strain: Not on file  Food Insecurity: Food Insecurity Present (05/13/2024)   Hunger Vital Sign    Worried About Running Out of Food in the Last Year: Often true    Ran Out of Food in the Last Year: Often true  Transportation Needs: Unmet Transportation Needs (05/13/2024)   PRAPARE - Administrator, Civil Service (Medical): Yes    Lack of Transportation (Non-Medical): Yes  Physical Activity: Not on file  Stress: Not on file  Social Connections: Not on file       Review of Systems see above; currently denies fever, headache, chest pain, dyspnea, cough, abdominal pain, nausea, vomiting or bleeding.  Vital Signs: BP 130/84 (BP Location: Left Arm)   Pulse 73   Temp 98 F (36.7 C) (Oral)   Resp 16   Ht 6\' 3"  (1.905 m)   Wt 216 lb 0.8 oz (98 kg)   SpO2 99%   BMI 27.00 kg/m   Advance Care Plan: No documents on file  Physical Exam awake, alert.  Chest clear to auscultation bilaterally.  Heart with regular rate and rhythm.  Abdomen soft, positive bowel sounds, nontender.  3+ LLE pitting edema; no significant right lower extremity edema  Imaging: CT CHEST ABDOMEN PELVIS W CONTRAST Result Date: 05/13/2024 CLINICAL DATA:  Metastatic disease evaluation. History of sacral mass. * Tracking Code: BO * EXAM: CT CHEST, ABDOMEN, AND PELVIS WITH CONTRAST TECHNIQUE: Multidetector CT imaging of the chest, abdomen and pelvis was performed following the standard protocol during bolus administration of intravenous contrast. RADIATION DOSE REDUCTION: This exam was performed according to the departmental dose-optimization program which includes automated exposure control, adjustment of  the mA and/or kV according to patient size and/or use of iterative reconstruction technique. CONTRAST:  100mL OMNIPAQUE IOHEXOL 300 MG/ML  SOLN COMPARISON:  MRI lumbar spine, MRI pelvis and CT angio runoff from 05/12/2024. FINDINGS: CT CHEST FINDINGS Cardiovascular: Normal cardiac size. No pericardial effusion. No aortic aneurysm. Mediastinum/Nodes: Visualized thyroid gland appears grossly unremarkable. No solid / cystic mediastinal masses. The esophagus is nondistended precluding optimal assessment. There are few mildly prominent mediastinal lymph nodes, which do not meet the size criteria for lymphadenopathy and though indeterminate most likely benign in etiology. No axillary or hilar lymphadenopathy by size criteria. Lungs/Pleura: The central tracheo-bronchial tree is patent. No mass or consolidation. No pleural effusion or pneumothorax. No suspicious lung nodules. Musculoskeletal: The visualized soft tissues of the chest wall are grossly unremarkable. No suspicious osseous lesions. There are mild multilevel degenerative changes in the visualized spine. CT ABDOMEN PELVIS FINDINGS Hepatobiliary: The liver is normal in size. Non-cirrhotic configuration. There are at least 2, ill-defined hypoattenuating masses in the right hepatic lobe, segment 5, which are incompletely characterized on the current exam but suspicious. Further evaluation fifth multiphasic contrast-enhanced MRI abdomen as per  liver mass protocol is recommended. No intrahepatic or extrahepatic bile duct dilation. Gallbladder is surgically absent. Pancreas: Unremarkable. No pancreatic ductal dilatation or surrounding inflammatory changes. Spleen: Within normal limits. No focal lesion. Adrenals/Urinary Tract: Adrenal glands are unremarkable. No suspicious renal mass. No hydronephrosis. No renal or ureteric calculi. Urinary bladder is under distended, precluding optimal assessment. However, no large mass or stones identified. No perivesical fat  stranding. Stomach/Bowel: No disproportionate dilation of the small or large bowel loops. No evidence of abnormal bowel wall thickening or inflammatory changes. The appendix is unremarkable. Bowel anastomotic suture noted in the right lower quadrant. Vascular/Lymphatic: No ascites or pneumoperitoneum. No abdominal or pelvic lymphadenopathy, by size criteria. No aneurysmal dilation of the major abdominal arteries. Reproductive: Normal size prostate. Symmetric seminal vesicles. Other: Periumbilical midline surgical scar noted. There is mild anasarca. There are soft tissue density areas in the anterior abdominal wall subcutaneous tissue, most likely due to medication injections. Musculoskeletal: There is focal expansion of the upper sacral spinal canal with remodeling of the upper sacrum. There are sclerotic areas within the upper sacrum. 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 Please refer to MRI lumbar spine report from 05/12/2024 for details. IMPRESSION: 1. There are at least 2, ill-defined hypoattenuating masses in the right hepatic lobe, segment 5, which are incompletely characterized on the current exam but suspicious. Further evaluation fifth multiphasic contrast-enhanced MRI abdomen as per liver mass protocol is recommended. 2. No metastatic disease identified within the chest. 3. Redemonstration of patient's known upper sacral mass. Please refer to MRI lumbar spine and MRI pelvis report of from yesterday for details. 4. Multiple other nonacute observations, as described above. Electronically Signed   By: Beula Brunswick M.D.   On: 05/13/2024 15:13   MR PELVIS W WO CONTRAST Result Date: 05/12/2024 CLINICAL DATA:  Sacral mass seen on recent CT scan and lumbar spine MRI. EXAM: MRI PELVIS WITHOUT AND WITH CONTRAST TECHNIQUE: Multiplanar multisequence MR imaging of the pelvis was performed both before and after administration of intravenous contrast. CONTRAST:   10mL GADAVIST GADOBUTROL 1 MMOL/ML IV SOLN COMPARISON:  CT scan 05/12/2024 and MRI lumbar spine 05/12/2024 FINDINGS: Urinary Tract: The bladder is unremarkable. No bladder mass or calculi. Bowel: Moderate pericolonic inflammatory changes but no rectal or sigmoid colon mass is identified. The visualized pelvic small bowel loops are unremarkable. Vascular/Lymphatic: The left common femoral vein, external iliac vein and left femoral vein are thrombosed. Associated surrounding inflammatory changes. This likely accounts for the patient's massive subcutaneous edema involving the left hip and thigh region. No overt lymphadenopathy the. Reproductive:  The prostate gland seminal vesicles are unremarkable. Other: Extensive tumor occupying the spinal canal as noted on the recent MRI lumbar spine. The tumor begins at the L5-S1 disc space and extends down to the bottom of S3. The spinal canal is markedly widened and tumor extends out through the sacral neural foramen. There is also involvement of all of the bony sacral segments with tumor and lumbar spine lesions are also noted. However, there is no destructive bony changes or obvious cortical erosion. There is also enhancing presacral tumor at the same levels. No associated lymphadenopathy but lymphoma is certainly a possibility. Musculoskeletal: Infiltrating lumbar and sacral lesions without destructive cortical changes possibly reflecting a process such as lymphoma. IMPRESSION: 1. 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 The spinal canal is markedly widened and tumor extends out through the sacral neural foramen. There is also involvement of all of  the bony sacral segments with tumor and lumbar spine lesions are also noted. However, there are no destructive bony changes or obvious cortical erosion. There is also enhancing presacral tumor at the same levels. No associated lymphadenopathy but lymphoma is certainly a possibility. 2. The left common femoral vein,  external iliac vein and left femoral vein are thrombosed. Associated surrounding inflammatory changes. This likely accounts for the patient's massive subcutaneous edema involving the left hip and thigh region. Electronically Signed   By: Marrian Siva M.D.   On: 05/12/2024 21:36   MR Lumbar Spine W Wo Contrast Result Date: 05/12/2024 CLINICAL DATA:  Metastatic disease evaluation, lower back pain radiating into the left glute muscles. Left leg swelling. EXAM: MRI LUMBAR SPINE WITHOUT AND WITH CONTRAST TECHNIQUE: Multiplanar and multiecho pulse sequences of the lumbar spine were obtained without and with intravenous contrast. CONTRAST:  10mL GADAVIST GADOBUTROL 1 MMOL/ML IV SOLN COMPARISON:  None Available. FINDINGS: Segmentation:  Standard. Alignment: Lumbar lordosis is maintained. Grade 1 anterolisthesis of L5 on S1. Vertebrae: There is a T1 hypointense, stir hyperintense lesion in the right posterior aspect of T12 which demonstrates mild enhancement. Lesion partially extends into the right pedicle of T12. Additional similar appearing lesion within the right anterior inferior aspect of the L5 vertebra. Irregularity of the L4 superior endplate with associated mild edema and enhancement likely reflecting Schmorl's nodes. Additional Modic type 1 degenerative endplate changes anteriorly at L3-4 with mild associated discogenic edema. Additional remote Schmorl's nodes in the lower thoracic and upper lumbar spine. Vertebral body heights otherwise maintained. There is a mildly enhancing STIR hyperintense mass along the dorsal aspect of the sacrum which measures 8.1 x 2.6 cm in maximum dimensions on sagittal images. There is additional signal abnormality within the visualized S1-S3 sacral vertebra with associated enhancement. Conus medullaris and cauda equina: Conus extends to the L1-2 level. Conus and cauda equina appear normal. Paraspinal and other soft tissues: There is presacral edema and enhancement which is  partially visualized. The paraspinal musculature is otherwise unremarkable. Disc levels: T12-L1: Minimal disc bulge. Facet arthrosis indents the dorsal thecal sac. No significant spinal canal stenosis. No significant foraminal stenosis. L1-2: Mild facet arthrosis indents the dorsal thecal sac. No significant spinal canal stenosis. No significant foraminal stenosis. L2-3: Minimal disc bulge which indents the ventral thecal sac with lateral recess narrowing without impingement upon the traversing nerve roots. Moderate facet arthrosis indents the dorsal thecal sac. Mild spinal canal stenosis. There is mild foraminal stenosis on the right. L3-4: Disc desiccation and moderate disc height loss. Diffuse disc bulge eccentric to the right which indents the ventral thecal sac with lateral recess narrowing more pronounced on the right. There is likely impingement upon the traversing right L4 nerve roots. Additional right paracentral component of disc extrusion with approximately 4 mm cranial migration of disc contents. Mild facet arthrosis indents the dorsal thecal sac. Mild spinal canal stenosis. There is moderate bilateral foraminal stenosis. Disc bulge likely contacts the exiting bilateral L3 nerve roots. L4-5: Small disc bulge resulting in lateral recess narrowing without significant impingement upon the traversing nerve roots. Moderate to severe facet arthrosis which indents the dorsal thecal sac. Mild spinal canal stenosis. There is mild foraminal stenosis on the left. L5-S1: Grade 1 anterolisthesis with partial uncovering of the disc. Diffuse disc bulge indents the ventral thecal sac with lateral recess narrowing without significant impingement upon the traversing nerve roots. Severe facet arthrosis indents the dorsal thecal sac. There is thickening of the ligamentum flavum which appears  edematous with associated enhancement. Mild-to-moderate spinal canal stenosis. There is moderate bilateral foraminal stenosis. Disc  bulge abuts the bilateral L5 nerve roots. Mass along the dorsal aspect of the sacrum abuts the ventral and inferior aspect of the thecal sac just below the level of the L5-S1 disc without evidence of extension into the thecal sac. The mass extends into the bilateral S1 sacral foramina in appears to displace the sacral nerve roots superiorly within the foramina. The tumor appears to envelop the nerve roots. Enhancement along the visualized sacral nerve roots concerning for perineural spread. 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 Large soft tissue mass along the dorsal aspect of the sacrum extending from the superior margin of S1 to S3. Associated bone marrow signal abnormality and enhancement of the visualized S1 through S3 vertebra concerning for additional sites of osseous metastatic disease. Soft tissue mass extends into the visualized S1 sacral foramina and appears to envelop the bilateral S1 nerve roots. Partially visualized presacral edema and enhancement. Degenerative changes of the lumbar spine as above. No high-grade spinal canal stenosis. Lateral recess narrowing at multiple levels. Disc bulge at L3-4 likely results in impingement of the traversing right L4 nerve roots. Multilevel foraminal stenosis, greatest and moderate bilaterally at L3-4 and L5-S1. Disc bulges at L3-4 and L5-S1 likely abut the exiting L3 and L5 nerve roots respectively. Electronically Signed   By: Denny Flack M.D.   On: 05/12/2024 20:25   CT ANGIO AO+BIFEM W & OR WO CONTRAST Result Date: 05/12/2024 CLINICAL DATA:  Left leg swelling, ?phlegmasia EXAM: CT ANGIOGRAPHY CHEST WITH CONTRAST TECHNIQUE: Multidetector CT imaging of the abdomen and pelvis with run-off to both lower extremities was performed using the standard protocol during bolus administration of intravenous contrast. Multiplanar CT image reconstructions and MIPs were obtained to evaluate the vascular anatomy.  RADIATION DOSE REDUCTION: This exam was performed according to the departmental dose-optimization program which includes automated exposure control, adjustment of the mA and/or kV according to patient size and/or use of iterative reconstruction technique. CONTRAST:  125mL OMNIPAQUE IOHEXOL 350 MG/ML SOLN COMPARISON:  None Available. FINDINGS: VASCULAR Aorta: No aneurysm or aortic dissection. No acute thrombus. Celiac: Patent without aneurysm, dissection, or hemodynamically significant stenosis. SMA: Patent without aneurysm, dissection, or hemodynamically significant stenosis. Renals: Patent without aneurysm, dissection, or hemodynamically significant stenosis. IMA: Patent without aneurysm, dissection, or hemodynamically significant stenosis. RIGHT Lower Extremity Inflow: Common, internal and external iliac arteries are patent without aneurysm, acute thrombus, or dissection. No plaque or hemodynamically significant stenosis. Outflow: Common, superficial and profunda femoral, and the popliteal arteries are patent without acute thrombus, aneurysm, or dissection. No plaque or hemodynamically significant stenosis. Runoff: Patent three vessel runoff to the foot. LEFT Lower Extremity Inflow: Common, internal and external iliac arteries are patent without aneurysm, acute thrombus, or dissection. No plaque or hemodynamically significant stenosis. Outflow: Common, superficial and profunda femoral, and the popliteal arteries are patent without acute thrombus, aneurysm, or dissection. No plaque or hemodynamically significant stenosis. Runoff: Patent three vessel runoff to the foot. Veins: Not evaluated due to the phase of contrast. Review of the MIP images confirms the above findings. NON-VASCULAR Lower chest: No focal airspace consolidation or pleural effusion. Hepatobiliary: No mass.Cholecystectomy.No intrahepatic or extrahepatic biliary ductal dilation. Pancreas: No mass or main ductal dilation.No peripancreatic inflammation  or fluid collection. Spleen: Normal size. No mass. Adrenals/Urinary Tract: No adrenal masses. No renal mass. No hydronephrosis or nephrolithiasis. Circumferential wall thickening of the urinary bladder. Stomach/Bowel:  The stomach is decompressed without focal abnormality. No small bowel wall thickening or inflammation. No small bowel obstruction. The appendix was not visualized. No right lower quadrant or pericecal inflammatory changes to suggest acute appendicitis. Mucosal hyperenhancement and mild wall thickening in the rectosigmoid colon. Presacral stranding also present. Vascular/Lymphatic: Mildly enlarged left external iliac chain lymph node measuring 1.3 cm (axial 219). Reproductive: No prostatomegaly.no free pelvic fluid. Other: No pneumoperitoneum, ascites, or mesenteric inflammation. Musculoskeletal: No acute fracture. Soft tissue mass within the spinal canal causing bony expansion and overlying destruction of the sacrum, centered at S2, measuring approximately 3.1 x 5.3 x 5.2 cm (axial 196). This causes significant narrowing of the S2 neural foramina.Moderate diffuse subcutaneous edema throughout the left leg with diffuse skin thickening present. Multilevel degenerative disc disease of the spine. IMPRESSION: VASCULAR 1. No aortic aneurysm or aortic dissection. 2. No acute thrombus or hemodynamically significant stenosis within the inflow or outflow vessels to either leg. Patent three-vessel runoff to the feet bilaterally. NON-VASCULAR 1. Mucosal hyperenhancement and mild wall thickening of the rectosigmoid colon with presacral stranding, which may reflect changes of an infectious or inflammatory proctocolitis. 2. Soft tissue mass within the spinal canal causing bony expansion and overlying destruction of the sacral bone, centered at S2, measuring approximately 3.1 x 5.3 x 5.2 cm. This is nonspecific, worrisome for underlying neoplasm. A follow-up pelvic MRI with IV contrast is recommended for further  characterization. Electronically Signed   By: Rance Burrows M.D.   On: 05/12/2024 13:53   VAS US  LOWER EXTREMITY VENOUS (DVT) (7a-7p) Result Date: 05/12/2024  Lower Venous DVT Study Patient Name:  Carlos Sullivan  Date of Exam:   05/12/2024 Medical Rec #: [id number]         Accession #:    [id number] Date of Birth: 04/11/1971          Patient Gender: M Patient Age:   47 years Exam Location:  [facility] Procedure:      VAS US  LOWER EXTREMITY VENOUS (DVT) Referring Phys: Russella Courts --------------------------------------------------------------------------------  Indications: Swelling.  Risk Factors: None identified. Limitations: Poor ultrasound/tissue interface. Comparison Study: No prior studies. Performing Technologist: Lerry Ransom RVT  Examination Guidelines: A complete evaluation includes B-mode imaging, spectral Doppler, color Doppler, and power Doppler as needed of all accessible portions of each vessel. Bilateral testing is considered an integral part of a complete examination. Limited examinations for reoccurring indications may be performed as noted. The reflux portion of the exam is performed with the patient in reverse Trendelenburg.  +-----+---------------+---------+-----------+----------+--------------+ RIGHTCompressibilityPhasicitySpontaneityPropertiesThrombus Aging +-----+---------------+---------+-----------+----------+--------------+ CFV  Full           Yes      Yes                                 +-----+---------------+---------+-----------+----------+--------------+   +---------+---------------+---------+-----------+----------+-------------------+ LEFT     CompressibilityPhasicitySpontaneityPropertiesThrombus Aging      +---------+---------------+---------+-----------+----------+-------------------+ CFV      Full           Yes      Yes                                      +---------+---------------+---------+-----------+----------+-------------------+  SFJ      Full                                                             +---------+---------------+---------+-----------+----------+-------------------+  FV Prox  Full                                                             +---------+---------------+---------+-----------+----------+-------------------+ FV Mid                  Yes      Yes                                      +---------+---------------+---------+-----------+----------+-------------------+ FV Distal               Yes      Yes                                      +---------+---------------+---------+-----------+----------+-------------------+ PFV      Full                                                             +---------+---------------+---------+-----------+----------+-------------------+ POP      Full           Yes      Yes                                      +---------+---------------+---------+-----------+----------+-------------------+ PTV      Full                                                             +---------+---------------+---------+-----------+----------+-------------------+ PERO                                                  Not well visualized +---------+---------------+---------+-----------+----------+-------------------+    Summary: RIGHT: - No evidence of common femoral vein obstruction.   LEFT: - There is no evidence of deep vein thrombosis in the lower extremity. However, portions of this examination were limited- see technologist comments above.  - No cystic structure found in the popliteal fossa.  *See table(s) above for measurements and observations. Electronically signed by Jimmye Moulds MD on 05/12/2024 at 10:25:20 AM.    Final     Labs:  CBC: Recent Labs    05/12/24 1154 05/13/24 0945  WBC 5.1 3.9*  HGB 10.2* 10.3*  HCT 31.7* 31.4*  PLT 126* 129*    COAGS: Recent Labs    05/13/24 0945  INR 1.2    BMP: Recent Labs     05/12/24 0943 05/13/24 0945  NA 139 138  K 3.8 3.6  CL 109 106  CO2 24 25  GLUCOSE 82 95  BUN 9 9  CALCIUM 8.6* 8.9  CREATININE 0.72 0.68  GFRNONAA >60 >60    LIVER FUNCTION TESTS: No results for input(s): "BILITOT", "AST", "ALT", "ALKPHOS", "PROT", "ALBUMIN" in the last 8760 hours.  TUMOR MARKERS: No results for input(s): "AFPTM", "CEA", "CA199", "CHROMGRNA" in the last 8760 hours.  Assessment and Plan: 53 y.o. male smoker with PMH sig for HLD, HTN, and questionable colon cancer 2018 at [facility] with reported bowel resection at that time who presents now with left leg swelling  as well as persistent back pain. 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   Large soft tissue mass along the dorsal aspect of the sacrum extending from the superior margin of S1 to S3. Associated bone marrow signal abnormality and enhancement of the visualized S1 through S3 vertebra concerning for additional sites of osseous metastatic disease.   Soft tissue mass extends into the visualized S1 sacral foramina and appears to envelop the bilateral S1 nerve roots.   Partially visualized presacral edema and enhancement.   Degenerative changes of the lumbar spine as above. No high-grade spinal canal stenosis. Lateral recess narrowing at multiple levels. Disc bulge at L3-4 likely results in impingement of the traversing right L4 nerve roots.   Multilevel foraminal stenosis, greatest and moderate bilaterally at L3-4 and L5-S1. Disc bulges at L3-4 and L5-S1 likely abut the exiting L3 and L5 nerve roots respectively.  MRI pelvis revealed:   1. 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 The spinal canal is markedly widened and tumor extends out through the sacral neural foramen. There is also involvement of all of the bony sacral segments with tumor and lumbar spine lesions are also noted. However, there are no  destructive bony changes or obvious cortical erosion. There is also enhancing presacral tumor at the same levels. No associated lymphadenopathy but lymphoma is certainly a possibility. 2. The left common femoral vein, external iliac vein and left femoral vein are thrombosed. Associated surrounding inflammatory changes. This likely accounts for the patient's massive subcutaneous edema involving the left hip and thigh region  CT C/A/P today revealed:  1. There are at least 2, ill-defined hypoattenuating masses in the right hepatic lobe, segment 5, which are incompletely characterized on the current exam but suspicious. Further evaluation fifth multiphasic contrast-enhanced MRI abdomen as per liver mass protocol is recommended. 2. No metastatic disease identified within the chest. 3. Redemonstration of patient's known upper sacral mass.   Request now received for image guided sacral mass biopsy for further evaluation. Pt currently on IV heparin.  Afebrile, WBC 3.9, hemoglobin 10.3, platelets 129k, PT/INR normal, creatinine normal; imaging studies have been reviewed by Dr. Marlena Sima.Risks and benefits of procedure was discussed with the patient including, but not limited to bleeding, infection, damage to adjacent structures or low yield requiring additional tests.  All of the questions were answered and there is agreement to proceed.  Consent signed and in chart.  Procedure planned for 5/16; will need to hold IV heparin 4 hours prior to biopsy  Thank you for allowing our service to participate in Carlos Sullivan 's care.  Electronically Signed: D. Honore Lux, PA-C   05/13/2024, 4:17 PM      I spent a total of 40 Minutes    in face to face in clinical consultation, greater than 50% of which was counseling/coordinating care for CT-guided biopsy of sacral mass

## 2024-05-13 NOTE — ED Triage Notes (Addendum)
 Pt bib EMS from home. He has swelling in L leg. Did CT yesterday here. No other complaints. Denies pain. Says he was told to come back today

## 2024-05-13 NOTE — ED Provider Notes (Addendum)
 [place] [facility] AT [facility] Provider Note   CSN: [id number] Arrival date & time: 05/13/24  6578     History  Chief Complaint  Patient presents with   abnormal scan    Carlos Sullivan is a 53 y.o. male.  53 year old male with medical history as detailed below presents for reevaluation.  Patient was seen yesterday for same complaint.  He could not stay given that he is currently in a halfway house after recent release of prison.  He is reporting today that he can stay for " as long as as needed".  Patient with about 3 weeks of left leg edema - after his recent release from prison he came to the ED yesterday for evaluation.  Workup included baseline labs, CT, MRI.  Patient was given 1 dose of Lovenox prior to leaving AGAINST MEDICAL ADVICE yesterday evening.  He is currently comfortable.  He denies current chest pain, shortness of breath, nausea, vomiting, fever.  He does not have any established outpatient care.  See imaging results below:  MR PELVIS W WO CONTRAST 1. 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 The spinal canal is markedly widened and tumor extends out through the sacral neural foramen. There is also involvement of all of the bony sacral segments with tumor and lumbar spine lesions are also noted. However, there are no destructive bony changes or obvious cortical erosion. There is also enhancing presacral tumor at the same levels. No associated lymphadenopathy but lymphoma is certainly a possibility. 2. The left common femoral vein, external iliac vein and left femoral vein are thrombosed. Associated surrounding inflammatory changes. This likely accounts for the patient's massive subcutaneous edema involving the left hip and thigh region.    MR Lumbar Spine W Wo Contrast Osseous lesions in the T12 and L5 vertebra concerning for osseous metastatic disease.   Large soft tissue mass along the dorsal aspect of the  sacrum extending from the superior margin of S1 to S3. Associated bone marrow signal abnormality and enhancement of the visualized S1 through S3 vertebra concerning for additional sites of osseous metastatic disease.   Soft tissue mass extends into the visualized S1 sacral foramina and appears to envelop the bilateral S1 nerve roots.   Partially visualized presacral edema and enhancement.   Degenerative changes of the lumbar spine as above. No high-grade spinal canal stenosis. Lateral recess narrowing at multiple levels. Disc bulge at L3-4 likely results in impingement of the traversing right L4 nerve roots.   Multilevel foraminal stenosis, greatest and moderate bilaterally at L3-4 and L5-S1. Disc bulges at L3-4 and L5-S1 likely abut the exiting L3 and L5 nerve roots respectively.      The history is provided by the patient and medical records.       Home Medications Prior to Admission medications   Medication Sig Start Date End Date Taking? Authorizing Provider  Buprenorphine HCl-Naloxone HCl 8-2 MG FILM Place 1 Film under the tongue 2 (two) times daily. 04/24/24   [provider]  ibuprofen  (ADVIL ) 800 MG tablet Take 800 mg by mouth in the morning and at bedtime.    [provider]  losartan (COZAAR) 25 MG tablet Take 25 mg by mouth daily.    [provider]      Allergies    Bee venom    Review of Systems   Review of Systems  All other systems reviewed and are negative.   Physical Exam Updated Vital Signs BP 127/88 (BP Location:  Left Arm)   Pulse 67   Temp 98.2 F (36.8 C) (Oral)   Resp 16   Ht 6\' 3"  (1.905 m)   Wt 98 kg   SpO2 100%   BMI 27.00 kg/m  Physical Exam Vitals and nursing note reviewed.  Constitutional:      General: He is not in acute distress.    Appearance: Normal appearance. He is well-developed.  HENT:     Head: Normocephalic and atraumatic.  Eyes:     Conjunctiva/sclera: Conjunctivae normal.     Pupils:  Pupils are equal, round, and reactive to light.  Cardiovascular:     Rate and Rhythm: Normal rate and regular rhythm.     Heart sounds: Normal heart sounds.  Pulmonary:     Effort: Pulmonary effort is normal. No respiratory distress.     Breath sounds: Normal breath sounds.  Abdominal:     General: There is no distension.     Palpations: Abdomen is soft.     Tenderness: There is no abdominal tenderness.  Musculoskeletal:        General: No deformity. Normal range of motion.     Cervical back: Normal range of motion and neck supple.     Left lower leg: Edema present.  Skin:    General: Skin is warm and dry.  Neurological:     General: No focal deficit present.     Mental Status: He is alert and oriented to person, place, and time. Mental status is at baseline.     ED Results / Procedures / Treatments   Labs (all labs ordered are listed, but only abnormal results are displayed) Labs Reviewed  BASIC METABOLIC PANEL WITH GFR  CBC WITH DIFFERENTIAL/PLATELET  PROTIME-INR    EKG None  Radiology MR PELVIS W WO CONTRAST Result Date: 05/12/2024 CLINICAL DATA:  Sacral mass seen on recent CT scan and lumbar spine MRI. EXAM: MRI PELVIS WITHOUT AND WITH CONTRAST TECHNIQUE: Multiplanar multisequence MR imaging of the pelvis was performed both before and after administration of intravenous contrast. CONTRAST:  10mL GADAVIST GADOBUTROL 1 MMOL/ML IV SOLN COMPARISON:  CT scan 05/12/2024 and MRI lumbar spine 05/12/2024 FINDINGS: Urinary Tract: The bladder is unremarkable. No bladder mass or calculi. Bowel: Moderate pericolonic inflammatory changes but no rectal or sigmoid colon mass is identified. The visualized pelvic small bowel loops are unremarkable. Vascular/Lymphatic: The left common femoral vein, external iliac vein and left femoral vein are thrombosed. Associated surrounding inflammatory changes. This likely accounts for the patient's massive subcutaneous edema involving the left hip and  thigh region. No overt lymphadenopathy the. Reproductive:  The prostate gland seminal vesicles are unremarkable. Other: Extensive tumor occupying the spinal canal as noted on the recent MRI lumbar spine. The tumor begins at the L5-S1 disc space and extends down to the bottom of S3. The spinal canal is markedly widened and tumor extends out through the sacral neural foramen. There is also involvement of all of the bony sacral segments with tumor and lumbar spine lesions are also noted. However, there is no destructive bony changes or obvious cortical erosion. There is also enhancing presacral tumor at the same levels. No associated lymphadenopathy but lymphoma is certainly a possibility. Musculoskeletal: Infiltrating lumbar and sacral lesions without destructive cortical changes possibly reflecting a process such as lymphoma. IMPRESSION: 1. 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 The spinal canal is markedly widened and tumor extends out through the sacral neural foramen. There is also involvement of all of the bony sacral  segments with tumor and lumbar spine lesions are also noted. However, there are no destructive bony changes or obvious cortical erosion. There is also enhancing presacral tumor at the same levels. No associated lymphadenopathy but lymphoma is certainly a possibility. 2. The left common femoral vein, external iliac vein and left femoral vein are thrombosed. Associated surrounding inflammatory changes. This likely accounts for the patient's massive subcutaneous edema involving the left hip and thigh region. Electronically Signed   By: Marrian Siva M.D.   On: 05/12/2024 21:36   MR Lumbar Spine W Wo Contrast Result Date: 05/12/2024 CLINICAL DATA:  Metastatic disease evaluation, lower back pain radiating into the left glute muscles. Left leg swelling. EXAM: MRI LUMBAR SPINE WITHOUT AND WITH CONTRAST TECHNIQUE: Multiplanar and multiecho pulse sequences of the lumbar spine were obtained  without and with intravenous contrast. CONTRAST:  10mL GADAVIST GADOBUTROL 1 MMOL/ML IV SOLN COMPARISON:  None Available. FINDINGS: Segmentation:  Standard. Alignment: Lumbar lordosis is maintained. Grade 1 anterolisthesis of L5 on S1. Vertebrae: There is a T1 hypointense, stir hyperintense lesion in the right posterior aspect of T12 which demonstrates mild enhancement. Lesion partially extends into the right pedicle of T12. Additional similar appearing lesion within the right anterior inferior aspect of the L5 vertebra. Irregularity of the L4 superior endplate with associated mild edema and enhancement likely reflecting Schmorl's nodes. Additional Modic type 1 degenerative endplate changes anteriorly at L3-4 with mild associated discogenic edema. Additional remote Schmorl's nodes in the lower thoracic and upper lumbar spine. Vertebral body heights otherwise maintained. There is a mildly enhancing STIR hyperintense mass along the dorsal aspect of the sacrum which measures 8.1 x 2.6 cm in maximum dimensions on sagittal images. There is additional signal abnormality within the visualized S1-S3 sacral vertebra with associated enhancement. Conus medullaris and cauda equina: Conus extends to the L1-2 level. Conus and cauda equina appear normal. Paraspinal and other soft tissues: There is presacral edema and enhancement which is partially visualized. The paraspinal musculature is otherwise unremarkable. Disc levels: T12-L1: Minimal disc bulge. Facet arthrosis indents the dorsal thecal sac. No significant spinal canal stenosis. No significant foraminal stenosis. L1-2: Mild facet arthrosis indents the dorsal thecal sac. No significant spinal canal stenosis. No significant foraminal stenosis. L2-3: Minimal disc bulge which indents the ventral thecal sac with lateral recess narrowing without impingement upon the traversing nerve roots. Moderate facet arthrosis indents the dorsal thecal sac. Mild spinal canal stenosis. There  is mild foraminal stenosis on the right. L3-4: Disc desiccation and moderate disc height loss. Diffuse disc bulge eccentric to the right which indents the ventral thecal sac with lateral recess narrowing more pronounced on the right. There is likely impingement upon the traversing right L4 nerve roots. Additional right paracentral component of disc extrusion with approximately 4 mm cranial migration of disc contents. Mild facet arthrosis indents the dorsal thecal sac. Mild spinal canal stenosis. There is moderate bilateral foraminal stenosis. Disc bulge likely contacts the exiting bilateral L3 nerve roots. L4-5: Small disc bulge resulting in lateral recess narrowing without significant impingement upon the traversing nerve roots. Moderate to severe facet arthrosis which indents the dorsal thecal sac. Mild spinal canal stenosis. There is mild foraminal stenosis on the left. L5-S1: Grade 1 anterolisthesis with partial uncovering of the disc. Diffuse disc bulge indents the ventral thecal sac with lateral recess narrowing without significant impingement upon the traversing nerve roots. Severe facet arthrosis indents the dorsal thecal sac. There is thickening of the ligamentum flavum which appears edematous with associated  enhancement. Mild-to-moderate spinal canal stenosis. There is moderate bilateral foraminal stenosis. Disc bulge abuts the bilateral L5 nerve roots. Mass along the dorsal aspect of the sacrum abuts the ventral and inferior aspect of the thecal sac just below the level of the L5-S1 disc without evidence of extension into the thecal sac. The mass extends into the bilateral S1 sacral foramina in appears to displace the sacral nerve roots superiorly within the foramina. The tumor appears to envelop the nerve roots. Enhancement along the visualized sacral nerve roots concerning for perineural spread. 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 Large soft  tissue mass along the dorsal aspect of the sacrum extending from the superior margin of S1 to S3. Associated bone marrow signal abnormality and enhancement of the visualized S1 through S3 vertebra concerning for additional sites of osseous metastatic disease. Soft tissue mass extends into the visualized S1 sacral foramina and appears to envelop the bilateral S1 nerve roots. Partially visualized presacral edema and enhancement. Degenerative changes of the lumbar spine as above. No high-grade spinal canal stenosis. Lateral recess narrowing at multiple levels. Disc bulge at L3-4 likely results in impingement of the traversing right L4 nerve roots. Multilevel foraminal stenosis, greatest and moderate bilaterally at L3-4 and L5-S1. Disc bulges at L3-4 and L5-S1 likely abut the exiting L3 and L5 nerve roots respectively. Electronically Signed   By: Denny Flack M.D.   On: 05/12/2024 20:25   CT ANGIO AO+BIFEM W & OR WO CONTRAST Result Date: 05/12/2024 CLINICAL DATA:  Left leg swelling, ?phlegmasia EXAM: CT ANGIOGRAPHY CHEST WITH CONTRAST TECHNIQUE: Multidetector CT imaging of the abdomen and pelvis with run-off to both lower extremities was performed using the standard protocol during bolus administration of intravenous contrast. Multiplanar CT image reconstructions and MIPs were obtained to evaluate the vascular anatomy. RADIATION DOSE REDUCTION: This exam was performed according to the departmental dose-optimization program which includes automated exposure control, adjustment of the mA and/or kV according to patient size and/or use of iterative reconstruction technique. CONTRAST:  125mL OMNIPAQUE IOHEXOL 350 MG/ML SOLN COMPARISON:  None Available. FINDINGS: VASCULAR Aorta: No aneurysm or aortic dissection. No acute thrombus. Celiac: Patent without aneurysm, dissection, or hemodynamically significant stenosis. SMA: Patent without aneurysm, dissection, or hemodynamically significant stenosis. Renals: Patent without  aneurysm, dissection, or hemodynamically significant stenosis. IMA: Patent without aneurysm, dissection, or hemodynamically significant stenosis. RIGHT Lower Extremity Inflow: Common, internal and external iliac arteries are patent without aneurysm, acute thrombus, or dissection. No plaque or hemodynamically significant stenosis. Outflow: Common, superficial and profunda femoral, and the popliteal arteries are patent without acute thrombus, aneurysm, or dissection. No plaque or hemodynamically significant stenosis. Runoff: Patent three vessel runoff to the foot. LEFT Lower Extremity Inflow: Common, internal and external iliac arteries are patent without aneurysm, acute thrombus, or dissection. No plaque or hemodynamically significant stenosis. Outflow: Common, superficial and profunda femoral, and the popliteal arteries are patent without acute thrombus, aneurysm, or dissection. No plaque or hemodynamically significant stenosis. Runoff: Patent three vessel runoff to the foot. Veins: Not evaluated due to the phase of contrast. Review of the MIP images confirms the above findings. NON-VASCULAR Lower chest: No focal airspace consolidation or pleural effusion. Hepatobiliary: No mass.Cholecystectomy.No intrahepatic or extrahepatic biliary ductal dilation. Pancreas: No mass or main ductal dilation.No peripancreatic inflammation or fluid collection. Spleen: Normal size. No mass. Adrenals/Urinary Tract: No adrenal masses. No renal mass. No hydronephrosis or nephrolithiasis. Circumferential wall thickening of the urinary bladder. Stomach/Bowel: The stomach is decompressed  without focal abnormality. No small bowel wall thickening or inflammation. No small bowel obstruction. The appendix was not visualized. No right lower quadrant or pericecal inflammatory changes to suggest acute appendicitis. Mucosal hyperenhancement and mild wall thickening in the rectosigmoid colon. Presacral stranding also present. Vascular/Lymphatic:  Mildly enlarged left external iliac chain lymph node measuring 1.3 cm (axial 219). Reproductive: No prostatomegaly.no free pelvic fluid. Other: No pneumoperitoneum, ascites, or mesenteric inflammation. Musculoskeletal: No acute fracture. Soft tissue mass within the spinal canal causing bony expansion and overlying destruction of the sacrum, centered at S2, measuring approximately 3.1 x 5.3 x 5.2 cm (axial 196). This causes significant narrowing of the S2 neural foramina.Moderate diffuse subcutaneous edema throughout the left leg with diffuse skin thickening present. Multilevel degenerative disc disease of the spine. IMPRESSION: VASCULAR 1. No aortic aneurysm or aortic dissection. 2. No acute thrombus or hemodynamically significant stenosis within the inflow or outflow vessels to either leg. Patent three-vessel runoff to the feet bilaterally. NON-VASCULAR 1. Mucosal hyperenhancement and mild wall thickening of the rectosigmoid colon with presacral stranding, which may reflect changes of an infectious or inflammatory proctocolitis. 2. Soft tissue mass within the spinal canal causing bony expansion and overlying destruction of the sacral bone, centered at S2, measuring approximately 3.1 x 5.3 x 5.2 cm. This is nonspecific, worrisome for underlying neoplasm. A follow-up pelvic MRI with IV contrast is recommended for further characterization. Electronically Signed   By: Rance Burrows M.D.   On: 05/12/2024 13:53   VAS US  LOWER EXTREMITY VENOUS (DVT) (7a-7p) Result Date: 05/12/2024  Lower Venous DVT Study Patient Name:  JULES SELIGA  Date of Exam:   05/12/2024 Medical Rec #: [id number]         Accession #:    [id number] Date of Birth: November 07, 1971          Patient Gender: M Patient Age:   18 years Exam Location:  [facility] Procedure:      VAS US  LOWER EXTREMITY VENOUS (DVT) Referring Phys: Russella Courts --------------------------------------------------------------------------------  Indications: Swelling.   Risk Factors: None identified. Limitations: Poor ultrasound/tissue interface. Comparison Study: No prior studies. Performing Technologist: Lerry Ransom RVT  Examination Guidelines: A complete evaluation includes B-mode imaging, spectral Doppler, color Doppler, and power Doppler as needed of all accessible portions of each vessel. Bilateral testing is considered an integral part of a complete examination. Limited examinations for reoccurring indications may be performed as noted. The reflux portion of the exam is performed with the patient in reverse Trendelenburg.  +-----+---------------+---------+-----------+----------+--------------+ RIGHTCompressibilityPhasicitySpontaneityPropertiesThrombus Aging +-----+---------------+---------+-----------+----------+--------------+ CFV  Full           Yes      Yes                                 +-----+---------------+---------+-----------+----------+--------------+   +---------+---------------+---------+-----------+----------+-------------------+ LEFT     CompressibilityPhasicitySpontaneityPropertiesThrombus Aging      +---------+---------------+---------+-----------+----------+-------------------+ CFV      Full           Yes      Yes                                      +---------+---------------+---------+-----------+----------+-------------------+ SFJ      Full                                                             +---------+---------------+---------+-----------+----------+-------------------+  FV Prox  Full                                                             +---------+---------------+---------+-----------+----------+-------------------+ FV Mid                  Yes      Yes                                      +---------+---------------+---------+-----------+----------+-------------------+ FV Distal               Yes      Yes                                       +---------+---------------+---------+-----------+----------+-------------------+ PFV      Full                                                             +---------+---------------+---------+-----------+----------+-------------------+ POP      Full           Yes      Yes                                      +---------+---------------+---------+-----------+----------+-------------------+ PTV      Full                                                             +---------+---------------+---------+-----------+----------+-------------------+ PERO                                                  Not well visualized +---------+---------------+---------+-----------+----------+-------------------+    Summary: RIGHT: - No evidence of common femoral vein obstruction.   LEFT: - There is no evidence of deep vein thrombosis in the lower extremity. However, portions of this examination were limited- see technologist comments above.  - No cystic structure found in the popliteal fossa.  *See table(s) above for measurements and observations. Electronically signed by Jimmye Moulds MD on 05/12/2024 at 10:25:20 AM.    Final     Procedures Procedures    Medications Ordered in ED Medications - No data to display  ED Course/ Medical Decision Making/ A&P                                 Medical Decision Making Amount and/or Complexity of Data Reviewed Labs: ordered.  Risk Prescription drug management. Decision regarding hospitalization.    Medical Screen Complete  This patient presented to the ED with complaint of left leg edema, back pain.  This complaint involves an extensive number of treatment options. The initial differential diagnosis includes, but is not limited to, malignancy, DVT, etc.  This presentation is: Acute, Chronic, Self-Limited, Previously Undiagnosed, Uncertain Prognosis, Complicated, Systemic Symptoms, and Threat to Life/Bodily Function  Patient returns for  reevaluation after evaluation yesterday in the ED.  Workup yesterday with evidence of significant thrombosis in the left common femoral vein, left lower extremity.  Patient was also diagnosed with extensive malignancy of the lower spine and sacrum.  Patient without PCP.  Patient without prior diagnosis of these issues.  Case discussed briefly with oncology.  Oncology  (Dr. Randye Buttner) recommends admission for further workup and agree with plan for heparinization. They will consult.   Neurosurgery made aware of case.  They feel that patient would best be served by having IR involvement for biopsy and having hospitalist service admit patient for metastatic disease workup.  Additional history obtained:  External records from outside sources obtained and reviewed including prior ED visits and prior Inpatient records.    Problem List / ED Course:  Left leg edema, thrombosis, metastatic malignancy   Disposition:  After consideration of the diagnostic results and the patients response to treatment, I feel that the patent would benefit from admission.          Final Clinical Impression(s) / ED Diagnoses Final diagnoses:  Deep vein thrombosis (DVT) of femoral vein of left lower extremity, unspecified chronicity (HCC)  Edema, unspecified type  Metastatic malignant neoplasm, unspecified site [facility])    Rx / DC Orders ED Discharge Orders     None         Burnette Carte, MD 05/13/24 1048    Burnette Carte, MD 05/13/24 1055

## 2024-05-14 ENCOUNTER — Inpatient Hospital Stay (HOSPITAL_COMMUNITY): Payer: MEDICAID

## 2024-05-14 LAB — COMPREHENSIVE METABOLIC PANEL WITH GFR
ALT: 14 U/L (ref 0–44)
AST: 13 U/L — ABNORMAL LOW (ref 15–41)
Albumin: 3.1 g/dL — ABNORMAL LOW (ref 3.5–5.0)
Alkaline Phosphatase: 64 U/L (ref 38–126)
Anion gap: 4 — ABNORMAL LOW (ref 5–15)
BUN: 10 mg/dL (ref 6–20)
CO2: 27 mmol/L (ref 22–32)
Calcium: 8.3 mg/dL — ABNORMAL LOW (ref 8.9–10.3)
Chloride: 107 mmol/L (ref 98–111)
Creatinine, Ser: 0.72 mg/dL (ref 0.61–1.24)
GFR, Estimated: >60 mL/min (ref >60)
Glucose, Bld: 100 mg/dL — ABNORMAL HIGH (ref 70–99)
Potassium: 3.4 mmol/L — ABNORMAL LOW (ref 3.5–5.1)
Sodium: 138 mmol/L (ref 135–145)
Total Bilirubin: 0.7 mg/dL (ref 0.0–1.2)
Total Protein: 6.2 g/dL — ABNORMAL LOW (ref 6.5–8.1)

## 2024-05-14 LAB — CBC
HCT: 33 % — ABNORMAL LOW (ref 39.0–52.0)
Hemoglobin: 10.3 g/dL — ABNORMAL LOW (ref 13.0–17.0)
MCH: 29.1 pg (ref 26.0–34.0)
MCHC: 31.2 g/dL (ref 30.0–36.0)
MCV: 93.2 fL (ref 80.0–100.0)
Platelets: 126 10*3/uL — ABNORMAL LOW (ref 150–400)
RBC: 3.54 MIL/uL — ABNORMAL LOW (ref 4.22–5.81)
RDW: 14.2 % (ref 11.5–15.5)
WBC: 4.2 10*3/uL (ref 4.0–10.5)
nRBC: 0 % (ref 0.0–0.2)

## 2024-05-14 LAB — HEPARIN LEVEL (UNFRACTIONATED): Heparin Unfractionated: 0.4 [IU]/mL (ref 0.30–0.70)

## 2024-05-14 MED ORDER — POTASSIUM CHLORIDE CRYS ER 20 MEQ PO TBCR
40.0000 meq | EXTENDED_RELEASE_TABLET | Freq: Once | ORAL | Status: AC
  Administered 2024-05-14: 40 meq via ORAL
  Filled 2024-05-14: qty 2

## 2024-05-14 MED ORDER — FENTANYL CITRATE (PF) 100 MCG/2ML IJ SOLN
INTRAMUSCULAR | Status: AC
  Filled 2024-05-14: qty 2

## 2024-05-14 MED ORDER — HEPARIN (PORCINE) 25000 UT/250ML-% IV SOLN
1850.0000 [IU]/h | INTRAVENOUS | Status: DC
  Administered 2024-05-15: 1750 [IU]/h via INTRAVENOUS
  Administered 2024-05-15: 1850 [IU]/h via INTRAVENOUS
  Filled 2024-05-14 (×2): qty 250

## 2024-05-14 MED ORDER — MIDAZOLAM HCL 2 MG/2ML IJ SOLN
INTRAMUSCULAR | Status: AC | PRN
  Administered 2024-05-14 (×2): 1 mg via INTRAVENOUS

## 2024-05-14 MED ORDER — BUPRENORPHINE HCL-NALOXONE HCL 8-2 MG SL SUBL
1.0000 | SUBLINGUAL_TABLET | Freq: Three times a day (TID) | SUBLINGUAL | Status: DC
  Administered 2024-05-14 – 2024-05-15 (×5): 1 via SUBLINGUAL
  Filled 2024-05-14 (×5): qty 1

## 2024-05-14 MED ORDER — FENTANYL CITRATE (PF) 100 MCG/2ML IJ SOLN
INTRAMUSCULAR | Status: AC | PRN
  Administered 2024-05-14 (×2): 50 ug via INTRAVENOUS

## 2024-05-14 MED ORDER — KETOROLAC TROMETHAMINE 30 MG/ML IJ SOLN
30.0000 mg | Freq: Three times a day (TID) | INTRAMUSCULAR | Status: DC | PRN
  Administered 2024-05-14 – 2024-05-15 (×3): 30 mg via INTRAVENOUS
  Filled 2024-05-14 (×3): qty 1

## 2024-05-14 MED ORDER — MIDAZOLAM HCL 2 MG/2ML IJ SOLN
INTRAMUSCULAR | Status: AC
Start: 2024-05-14 — End: ?
  Filled 2024-05-14: qty 2

## 2024-05-14 NOTE — Procedures (Signed)
  Procedure:  CT core biopsy sacral mass 18g x3 in saline Preprocedure diagnosis: The primary encounter diagnosis was Deep vein thrombosis (DVT) of femoral vein of left lower extremity, unspecified chronicity (HCC). Diagnoses of Edema, unspecified type and Metastatic malignant neoplasm, unspecified site [facility]) were also pertinent to this visit. Postprocedure diagnosis: same EBL:    minimal Complications:   none immediate  See full dictation in [employer].  Nicky Barrack MD Main # [phone] Pager  [phone] Mobile [phone]

## 2024-05-14 NOTE — Plan of Care (Signed)
  Problem: Activity: Goal: Risk for activity intolerance will decrease Outcome: Progressing   Problem: Coping: Goal: Level of anxiety will decrease Outcome: Progressing   Problem: Elimination: Goal: Will not experience complications related to urinary retention Outcome: Progressing   Problem: Pain Managment: Goal: General experience of comfort will improve and/or be controlled Outcome: Progressing   Problem: Safety: Goal: Ability to remain free from injury will improve Outcome: Progressing   Problem: Skin Integrity: Goal: Risk for impaired skin integrity will decrease Outcome: Progressing

## 2024-05-14 NOTE — Progress Notes (Signed)
 PHARMACY - ANTICOAGULATION CONSULT NOTE  Pharmacy Consult for Heparin Indication: DVT  Allergies  Allergen Reactions   Bee Venom Anaphylaxis    Patient Measurements: Height: 6\' 3"  (190.5 cm) Weight: 98 kg (216 lb 0.8 oz) IBW/kg (Calculated) : 84.5 HEPARIN DW (KG): 98  Vital Signs: Temp: 98.1 F (36.7 C) (05/15 2136) Temp Source: Oral (05/15 2136) BP: 122/71 (05/15 2136) Pulse Rate: 65 (05/15 2136)  Labs: Recent Labs    05/12/24 0943 05/12/24 1154 05/12/24 1154 05/13/24 0945 05/13/24 1701 05/14/24 0117  HGB  --  10.2*   < > 10.3*  --  10.3*  HCT  --  31.7*  --  31.4*  --  33.0*  PLT  --  126*  --  129*  --  126*  LABPROT  --   --   --  14.9  --   --   INR  --   --   --  1.2  --   --   HEPARINUNFRC  --   --   --   --  0.30 0.40  CREATININE 0.72  --   --  0.68  --   --    < > = values in this interval not displayed.    Estimated Creatinine Clearance: 132 mL/min (by C-G formula based on SCr of 0.68 mg/dL).   Medical History: Past Medical History:  Diagnosis Date   Cancer (HCC)    Hyperlipemia    Hypertension     Medications:  Medications Prior to Admission  Medication Sig Dispense Refill Last Dose/Taking   Buprenorphine HCl-Naloxone HCl 8-2 MG FILM Place 1 Film under the tongue in the morning, at noon, and at bedtime.   05/13/2024   ibuprofen  (ADVIL ) 800 MG tablet Take 800 mg by mouth in the morning and at bedtime.   05/13/2024   losartan (COZAAR) 25 MG tablet Take 25 mg by mouth daily.   05/13/2024    Assessment: 53 y/o M  presented to ED 5/14 with L leg swelling. 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    Patient received received Lovenox 1 mg/kg Harvey x 1 approximately twelve hours ago (97.5 mg given 5/14 at 2213).   Today, 05/14/24: Heparin level 0.4 therapeutic on 1750 units/hr Hgb 10.3, plts 126--low, stable No s/sx of bleeding reported   Goal of Therapy:  Heparin level  0.3-0.7 units/ml Monitor platelets by anticoagulation protocol: Yes   Plan:  Continue heparin drip at 1750 units/hr Monitor heparin level, CBC, and s/sx of bleeding daily Per IR, hold heparin gtt 4 hours prior to biopsy scheduled 5/16 (IR communicated to RN to hold heparin 5/16 @0800 )  Beau Bound RPh 05/14/2024, 1:48 AM

## 2024-05-14 NOTE — Progress Notes (Signed)
 PHARMACY - ANTICOAGULATION CONSULT NOTE  Pharmacy Consult for Heparin Indication: DVT  Allergies  Allergen Reactions   Bee Venom Anaphylaxis    Patient Measurements: Height: 6\' 3"  (190.5 cm) Weight: 98 kg (216 lb 0.8 oz) IBW/kg (Calculated) : 84.5 HEPARIN DW (KG): 98  Vital Signs: Temp: 97.8 F (36.6 C) (05/16 1331) Temp Source: Oral (05/16 1331) BP: 132/79 (05/16 1510) Pulse Rate: 65 (05/16 1510)  Labs: Recent Labs    05/12/24 0943 05/12/24 1154 05/12/24 1154 05/13/24 0945 05/13/24 1701 05/14/24 0117  HGB  --  10.2*   < > 10.3*  --  10.3*  HCT  --  31.7*  --  31.4*  --  33.0*  PLT  --  126*  --  129*  --  126*  LABPROT  --   --   --  14.9  --   --   INR  --   --   --  1.2  --   --   HEPARINUNFRC  --   --   --   --  0.30 0.40  CREATININE 0.72  --   --  0.68  --  0.72   < > = values in this interval not displayed.    Estimated Creatinine Clearance: 132 mL/min (by C-G formula based on SCr of 0.72 mg/dL).   Medical History: Past Medical History:  Diagnosis Date   Cancer (HCC)    Hyperlipemia    Hypertension     Medications:  Medications Prior to Admission  Medication Sig Dispense Refill Last Dose/Taking   Buprenorphine HCl-Naloxone HCl 8-2 MG FILM Place 1 Film under the tongue in the morning, at noon, and at bedtime.   05/13/2024   ibuprofen  (ADVIL ) 800 MG tablet Take 800 mg by mouth in the morning and at bedtime.   05/13/2024   losartan (COZAAR) 25 MG tablet Take 25 mg by mouth daily.   05/13/2024    Assessment: 53 y/o M  presented to ED 5/14 with L leg swelling. 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    Today, 05/14/24: Heparin level 0.4 earlier this AM - therapeutic on 1750 units/hr Hgb 10.3, plts 126--low, stable No s/sx of bleeding reported Patient had sacral mass biopsy done this afternoon - heparin paused since 0800. Pharmacy consulted for UFH restart post-IR procedure.  Given this procedure is considered high bleed risk, will plan to restart heparin gtt in 12hrs, per pharmacy protocol.   Goal of Therapy:  Heparin level 0.3-0.7 units/ml Monitor platelets by anticoagulation protocol: Yes   Plan:  Restart heparin 1750 units/hr 5/17 @0300  Check heparin level 6h after heparin restart  Monitor heparin level, CBC, and s/sx of bleeding daily F/u transition to PO anticoagulation    Roselee Cong, PharmD Clinical Pharmacist  5/16/20254:29 PM

## 2024-05-14 NOTE — Progress Notes (Signed)
 Progress Note    Carlos Sullivan  BJY:[id number] DOB: Apr 22, 1971  DOA: 05/13/2024 PCP: Patient, [name]    Brief Narrative:     Medical records reviewed and are as summarized below:  Carlos Sullivan is an 53 y.o. male with medical history significant of umbilical hernia repair and questionable colon cancer/lesion in 2018 at [facility].  Patient was a prisoner at that time so unable to access records in Care Everywhere but he reports they also remove part of his small bowel due to lesion on his intestines.  He reports that the lesion was nonmalignant/slow growing. Patient initially reported to the ER on 5/13 for left leg swelling worsened over the last 3 weeks but appears he left before being seen.  Patient then came back to the ER on 5/14 where he had an ultrasound of his leg that was negative for DVTs.  He also had CT that showed a tumor on his sacrum  ER plan for a pelvic/lumbar MRI that showed extensive tumor occupying the lower spinal canal.  The spinal canal was markedly widened and tumor extends out through the sacral foramen.  Patient also had no associated lymphadenopathy.  CT scan showed left common femoral vein and iliac vein as well as left femoral vein were thrombosed.  He then again left AGAINST MEDICAL ADVICE due to needing to report back to his halfway house.  He was given a dose of Lovenox.  Patient comes back to the ER on 5/15 where the ER reached out to both neurosurgery and oncology.  Neurosurgery recommends a CT chest abdomen pelvis for metastatic workup and IR guided biopsy for further treatment.  They recommend patient to remain at [facility] for oncology evaluation.  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 Barnett Libel) -I have requested records from [facility] including  pathology from his surgery in 2018   Anemia - Trend hemoglobin   Thrombosis of the left common femoral vein with lower extremity swelling - IV heparin while workup on-going -will need eventual NOAC -Elevate extremity   Hypokalema -replete  Chronic pain -resume suboxone  HTN -resume home BP med when able   Family Communication/Anticipated D/C date and plan/Code Status   DVT prophylaxis: heparin Code Status: Full Code.   Disposition Plan: Status is: Inpatient      Medical Consultants:   NS (phone) Oncology IR    Subjective:   Leg swelling better Takes suboxone TID-- dose just adjusted outpatient  Objective:    Vitals:   05/13/24 1223 05/13/24 2136 05/14/24 0511 05/14/24 1000  BP: 130/84 122/71 115/69 122/70  Pulse: 73 65 (!) 59 71  Resp: 16 18 18 18   Temp: 98 F (36.7 C) 98.1 F (36.7 C) 97.8 F (36.6 C) 98.3 F (36.8 C)  TempSrc: Oral Oral Oral Oral  SpO2: 99% 99% 97% 98%  Weight:      Height:        Intake/Output Summary (Last 24 hours) at 05/14/2024 1124 Last data filed at 05/14/2024 1000 Gross per 24 hour  Intake 721.66 ml  Output 1150 ml  Net -428.34 ml   Filed Weights   05/13/24 0934  Weight: 98 kg    Exam:  General: Appearance:     Overweight male in no acute distress  Lungs:     respirations unlabored  Heart:    Normal heart rate. Normal rhythm. No murmurs, rubs, or gallops.   MS:   All extremities are intact. L lower leg swelling improved  Neurologic:   Awake, alert     Data Reviewed:   I have personally reviewed following labs and imaging studies:  Labs: Labs show the following:   Basic Metabolic Panel: Recent Labs  Lab 05/12/24 0943 05/13/24 0945 05/14/24 0117  NA 139 138 138  K 3.8 3.6 3.4*  CL 109 106 107  CO2 24 25 27   GLUCOSE 82 95 100*  BUN 9 9 10   CREATININE 0.72 0.68 0.72  CALCIUM 8.6* 8.9 8.3*   GFR Estimated Creatinine Clearance: 132 mL/min (by C-G formula based on SCr of 0.72 mg/dL). Liver  Function Tests: Recent Labs  Lab 05/14/24 0117  AST 13*  ALT 14  ALKPHOS 64  BILITOT 0.7  PROT 6.2*  ALBUMIN 3.1*   No results for input(s): "LIPASE", "AMYLASE" in the last 168 hours. No results for input(s): "AMMONIA" in the last 168 hours. Coagulation profile Recent Labs  Lab 05/13/24 0945  INR 1.2    CBC: Recent Labs  Lab 05/12/24 1154 05/13/24 0945 05/14/24 0117  WBC 5.1 3.9* 4.2  NEUTROABS 2.9 2.2  --   HGB 10.2* 10.3* 10.3*  HCT 31.7* 31.4* 33.0*  MCV 89.5 88.5 93.2  PLT 126* 129* 126*   Cardiac Enzymes: No results for input(s): "CKTOTAL", "CKMB", "CKMBINDEX", "TROPONINI" in the last 168 hours. BNP (last 3 results) No results for input(s): "PROBNP" in the last 8760 hours. CBG: No results for input(s): "GLUCAP" in the last 168 hours. D-Dimer: No results for input(s): "DDIMER" in the last 72 hours. Hgb A1c: No results for input(s): "HGBA1C" in the last 72 hours. Lipid Profile: No results for input(s): "CHOL", "HDL", "LDLCALC", "TRIG", "CHOLHDL", "LDLDIRECT" in the last 72 hours. Thyroid function studies: Recent Labs    05/12/24 0943  TSH 0.886   Anemia work up: No results for input(s): "VITAMINB12", "FOLATE", "FERRITIN", "TIBC", "IRON", "RETICCTPCT" in the last 72 hours. Sepsis Labs: Recent Labs  Lab 05/12/24 1154 05/13/24 0945 05/14/24 0117  WBC 5.1 3.9* 4.2    Microbiology No results found for this or any previous visit (from the past 240 hours).  Procedures and diagnostic studies:  CT CHEST ABDOMEN PELVIS W CONTRAST Result Date: 05/13/2024 CLINICAL DATA:  Metastatic disease evaluation. History of sacral mass. * Tracking Code: BO * EXAM: CT CHEST, ABDOMEN, AND PELVIS WITH CONTRAST TECHNIQUE: Multidetector CT imaging of the chest, abdomen and pelvis was performed following the standard protocol during bolus administration of intravenous contrast. RADIATION DOSE REDUCTION: This exam was performed according to the departmental dose-optimization  program which includes automated exposure control, adjustment of the mA and/or kV according to patient size and/or use of iterative reconstruction technique. CONTRAST:  100mL OMNIPAQUE IOHEXOL 300 MG/ML  SOLN COMPARISON:  MRI lumbar spine, MRI pelvis and CT angio runoff from 05/12/2024. FINDINGS: CT CHEST FINDINGS Cardiovascular: Normal cardiac size. No pericardial effusion. No aortic aneurysm. Mediastinum/Nodes: Visualized thyroid gland appears grossly unremarkable. No solid / cystic mediastinal masses. The esophagus is nondistended precluding optimal assessment. There are few mildly prominent mediastinal lymph nodes, which do not meet the size criteria for lymphadenopathy and though indeterminate most likely benign in etiology. No axillary or hilar lymphadenopathy by size criteria. Lungs/Pleura: The central tracheo-bronchial tree is patent. No mass or consolidation. No pleural effusion or pneumothorax. No suspicious lung nodules. Musculoskeletal:  The visualized soft tissues of the chest wall are grossly unremarkable. No suspicious osseous lesions. There are mild multilevel degenerative changes in the visualized spine. CT ABDOMEN PELVIS FINDINGS Hepatobiliary: The liver is normal in size. Non-cirrhotic configuration. There are at least 2, ill-defined hypoattenuating masses in the right hepatic lobe, segment 5, which are incompletely characterized on the current exam but suspicious. Further evaluation fifth multiphasic contrast-enhanced MRI abdomen as per liver mass protocol is recommended. No intrahepatic or extrahepatic bile duct dilation. Gallbladder is surgically absent. Pancreas: Unremarkable. No pancreatic ductal dilatation or surrounding inflammatory changes. Spleen: Within normal limits. No focal lesion. Adrenals/Urinary Tract: Adrenal glands are unremarkable. No suspicious renal mass. No hydronephrosis. No renal or ureteric calculi. Urinary bladder is under distended, precluding optimal assessment. However,  no large mass or stones identified. No perivesical fat stranding. Stomach/Bowel: No disproportionate dilation of the small or large bowel loops. No evidence of abnormal bowel wall thickening or inflammatory changes. The appendix is unremarkable. Bowel anastomotic suture noted in the right lower quadrant. Vascular/Lymphatic: No ascites or pneumoperitoneum. No abdominal or pelvic lymphadenopathy, by size criteria. No aneurysmal dilation of the major abdominal arteries. Reproductive: Normal size prostate. Symmetric seminal vesicles. Other: Periumbilical midline surgical scar noted. There is mild anasarca. There are soft tissue density areas in the anterior abdominal wall subcutaneous tissue, most likely due to medication injections. Musculoskeletal: There is focal expansion of the upper sacral spinal canal with remodeling of the upper sacrum. There are sclerotic areas within the upper sacrum. 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 Please refer to MRI lumbar spine report from 05/12/2024 for details. IMPRESSION: 1. There are at least 2, ill-defined hypoattenuating masses in the right hepatic lobe, segment 5, which are incompletely characterized on the current exam but suspicious. Further evaluation fifth multiphasic contrast-enhanced MRI abdomen as per liver mass protocol is recommended. 2. No metastatic disease identified within the chest. 3. Redemonstration of patient's known upper sacral mass. Please refer to MRI lumbar spine and MRI pelvis report of from yesterday for details. 4. Multiple other nonacute observations, as described above. Electronically Signed   By: Beula Brunswick M.D.   On: 05/13/2024 15:13   MR PELVIS W WO CONTRAST Result Date: 05/12/2024 CLINICAL DATA:  Sacral mass seen on recent CT scan and lumbar spine MRI. EXAM: MRI PELVIS WITHOUT AND WITH CONTRAST TECHNIQUE: Multiplanar multisequence MR imaging of the pelvis was performed both before and  after administration of intravenous contrast. CONTRAST:  10mL GADAVIST GADOBUTROL 1 MMOL/ML IV SOLN COMPARISON:  CT scan 05/12/2024 and MRI lumbar spine 05/12/2024 FINDINGS: Urinary Tract: The bladder is unremarkable. No bladder mass or calculi. Bowel: Moderate pericolonic inflammatory changes but no rectal or sigmoid colon mass is identified. The visualized pelvic small bowel loops are unremarkable. Vascular/Lymphatic: The left common femoral vein, external iliac vein and left femoral vein are thrombosed. Associated surrounding inflammatory changes. This likely accounts for the patient's massive subcutaneous edema involving the left hip and thigh region. No overt lymphadenopathy the. Reproductive:  The prostate gland seminal vesicles are unremarkable. Other: Extensive tumor occupying the spinal canal as noted on the recent MRI lumbar spine. The tumor begins at the L5-S1 disc space and extends down to the bottom of S3. The spinal canal is markedly widened and tumor extends out through the sacral neural foramen. There is also involvement of all of the bony sacral segments with tumor and lumbar spine lesions are also noted. However, there is no destructive bony  changes or obvious cortical erosion. There is also enhancing presacral tumor at the same levels. No associated lymphadenopathy but lymphoma is certainly a possibility. Musculoskeletal: Infiltrating lumbar and sacral lesions without destructive cortical changes possibly reflecting a process such as lymphoma. IMPRESSION: 1. 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 The spinal canal is markedly widened and tumor extends out through the sacral neural foramen. There is also involvement of all of the bony sacral segments with tumor and lumbar spine lesions are also noted. However, there are no destructive bony changes or obvious cortical erosion. There is also enhancing presacral tumor at the same levels. No associated lymphadenopathy but lymphoma is  certainly a possibility. 2. The left common femoral vein, external iliac vein and left femoral vein are thrombosed. Associated surrounding inflammatory changes. This likely accounts for the patient's massive subcutaneous edema involving the left hip and thigh region. Electronically Signed   By: Marrian Siva M.D.   On: 05/12/2024 21:36   MR Lumbar Spine W Wo Contrast Result Date: 05/12/2024 CLINICAL DATA:  Metastatic disease evaluation, lower back pain radiating into the left glute muscles. Left leg swelling. EXAM: MRI LUMBAR SPINE WITHOUT AND WITH CONTRAST TECHNIQUE: Multiplanar and multiecho pulse sequences of the lumbar spine were obtained without and with intravenous contrast. CONTRAST:  10mL GADAVIST GADOBUTROL 1 MMOL/ML IV SOLN COMPARISON:  None Available. FINDINGS: Segmentation:  Standard. Alignment: Lumbar lordosis is maintained. Grade 1 anterolisthesis of L5 on S1. Vertebrae: There is a T1 hypointense, stir hyperintense lesion in the right posterior aspect of T12 which demonstrates mild enhancement. Lesion partially extends into the right pedicle of T12. Additional similar appearing lesion within the right anterior inferior aspect of the L5 vertebra. Irregularity of the L4 superior endplate with associated mild edema and enhancement likely reflecting Schmorl's nodes. Additional Modic type 1 degenerative endplate changes anteriorly at L3-4 with mild associated discogenic edema. Additional remote Schmorl's nodes in the lower thoracic and upper lumbar spine. Vertebral body heights otherwise maintained. There is a mildly enhancing STIR hyperintense mass along the dorsal aspect of the sacrum which measures 8.1 x 2.6 cm in maximum dimensions on sagittal images. There is additional signal abnormality within the visualized S1-S3 sacral vertebra with associated enhancement. Conus medullaris and cauda equina: Conus extends to the L1-2 level. Conus and cauda equina appear normal. Paraspinal and other soft tissues:  There is presacral edema and enhancement which is partially visualized. The paraspinal musculature is otherwise unremarkable. Disc levels: T12-L1: Minimal disc bulge. Facet arthrosis indents the dorsal thecal sac. No significant spinal canal stenosis. No significant foraminal stenosis. L1-2: Mild facet arthrosis indents the dorsal thecal sac. No significant spinal canal stenosis. No significant foraminal stenosis. L2-3: Minimal disc bulge which indents the ventral thecal sac with lateral recess narrowing without impingement upon the traversing nerve roots. Moderate facet arthrosis indents the dorsal thecal sac. Mild spinal canal stenosis. There is mild foraminal stenosis on the right. L3-4: Disc desiccation and moderate disc height loss. Diffuse disc bulge eccentric to the right which indents the ventral thecal sac with lateral recess narrowing more pronounced on the right. There is likely impingement upon the traversing right L4 nerve roots. Additional right paracentral component of disc extrusion with approximately 4 mm cranial migration of disc contents. Mild facet arthrosis indents the dorsal thecal sac. Mild spinal canal stenosis. There is moderate bilateral foraminal stenosis. Disc bulge likely contacts the exiting bilateral L3 nerve roots. L4-5: Small disc bulge resulting in lateral recess narrowing without significant impingement upon the traversing  nerve roots. Moderate to severe facet arthrosis which indents the dorsal thecal sac. Mild spinal canal stenosis. There is mild foraminal stenosis on the left. L5-S1: Grade 1 anterolisthesis with partial uncovering of the disc. Diffuse disc bulge indents the ventral thecal sac with lateral recess narrowing without significant impingement upon the traversing nerve roots. Severe facet arthrosis indents the dorsal thecal sac. There is thickening of the ligamentum flavum which appears edematous with associated enhancement. Mild-to-moderate spinal canal stenosis. There  is moderate bilateral foraminal stenosis. Disc bulge abuts the bilateral L5 nerve roots. Mass along the dorsal aspect of the sacrum abuts the ventral and inferior aspect of the thecal sac just below the level of the L5-S1 disc without evidence of extension into the thecal sac. The mass extends into the bilateral S1 sacral foramina in appears to displace the sacral nerve roots superiorly within the foramina. The tumor appears to envelop the nerve roots. Enhancement along the visualized sacral nerve roots concerning for perineural spread. 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 Large soft tissue mass along the dorsal aspect of the sacrum extending from the superior margin of S1 to S3. Associated bone marrow signal abnormality and enhancement of the visualized S1 through S3 vertebra concerning for additional sites of osseous metastatic disease. Soft tissue mass extends into the visualized S1 sacral foramina and appears to envelop the bilateral S1 nerve roots. Partially visualized presacral edema and enhancement. Degenerative changes of the lumbar spine as above. No high-grade spinal canal stenosis. Lateral recess narrowing at multiple levels. Disc bulge at L3-4 likely results in impingement of the traversing right L4 nerve roots. Multilevel foraminal stenosis, greatest and moderate bilaterally at L3-4 and L5-S1. Disc bulges at L3-4 and L5-S1 likely abut the exiting L3 and L5 nerve roots respectively. Electronically Signed   By: Denny Flack M.D.   On: 05/12/2024 20:25   CT ANGIO AO+BIFEM W & OR WO CONTRAST Result Date: 05/12/2024 CLINICAL DATA:  Left leg swelling, ?phlegmasia EXAM: CT ANGIOGRAPHY CHEST WITH CONTRAST TECHNIQUE: Multidetector CT imaging of the abdomen and pelvis with run-off to both lower extremities was performed using the standard protocol during bolus administration of intravenous contrast. Multiplanar CT image reconstructions and MIPs were  obtained to evaluate the vascular anatomy. RADIATION DOSE REDUCTION: This exam was performed according to the departmental dose-optimization program which includes automated exposure control, adjustment of the mA and/or kV according to patient size and/or use of iterative reconstruction technique. CONTRAST:  125mL OMNIPAQUE IOHEXOL 350 MG/ML SOLN COMPARISON:  None Available. FINDINGS: VASCULAR Aorta: No aneurysm or aortic dissection. No acute thrombus. Celiac: Patent without aneurysm, dissection, or hemodynamically significant stenosis. SMA: Patent without aneurysm, dissection, or hemodynamically significant stenosis. Renals: Patent without aneurysm, dissection, or hemodynamically significant stenosis. IMA: Patent without aneurysm, dissection, or hemodynamically significant stenosis. RIGHT Lower Extremity Inflow: Common, internal and external iliac arteries are patent without aneurysm, acute thrombus, or dissection. No plaque or hemodynamically significant stenosis. Outflow: Common, superficial and profunda femoral, and the popliteal arteries are patent without acute thrombus, aneurysm, or dissection. No plaque or hemodynamically significant stenosis. Runoff: Patent three vessel runoff to the foot. LEFT Lower Extremity Inflow: Common, internal and external iliac arteries are patent without aneurysm, acute thrombus, or dissection. No plaque or hemodynamically significant stenosis. Outflow: Common, superficial and profunda femoral, and the popliteal arteries are patent without acute thrombus, aneurysm, or dissection. No plaque or hemodynamically significant stenosis. Runoff: Patent three vessel runoff to the foot. Veins: Not evaluated  due to the phase of contrast. Review of the MIP images confirms the above findings. NON-VASCULAR Lower chest: No focal airspace consolidation or pleural effusion. Hepatobiliary: No mass.Cholecystectomy.No intrahepatic or extrahepatic biliary ductal dilation. Pancreas: No mass or main  ductal dilation.No peripancreatic inflammation or fluid collection. Spleen: Normal size. No mass. Adrenals/Urinary Tract: No adrenal masses. No renal mass. No hydronephrosis or nephrolithiasis. Circumferential wall thickening of the urinary bladder. Stomach/Bowel: The stomach is decompressed without focal abnormality. No small bowel wall thickening or inflammation. No small bowel obstruction. The appendix was not visualized. No right lower quadrant or pericecal inflammatory changes to suggest acute appendicitis. Mucosal hyperenhancement and mild wall thickening in the rectosigmoid colon. Presacral stranding also present. Vascular/Lymphatic: Mildly enlarged left external iliac chain lymph node measuring 1.3 cm (axial 219). Reproductive: No prostatomegaly.no free pelvic fluid. Other: No pneumoperitoneum, ascites, or mesenteric inflammation. Musculoskeletal: No acute fracture. Soft tissue mass within the spinal canal causing bony expansion and overlying destruction of the sacrum, centered at S2, measuring approximately 3.1 x 5.3 x 5.2 cm (axial 196). This causes significant narrowing of the S2 neural foramina.Moderate diffuse subcutaneous edema throughout the left leg with diffuse skin thickening present. Multilevel degenerative disc disease of the spine. IMPRESSION: VASCULAR 1. No aortic aneurysm or aortic dissection. 2. No acute thrombus or hemodynamically significant stenosis within the inflow or outflow vessels to either leg. Patent three-vessel runoff to the feet bilaterally. NON-VASCULAR 1. Mucosal hyperenhancement and mild wall thickening of the rectosigmoid colon with presacral stranding, which may reflect changes of an infectious or inflammatory proctocolitis. 2. Soft tissue mass within the spinal canal causing bony expansion and overlying destruction of the sacral bone, centered at S2, measuring approximately 3.1 x 5.3 x 5.2 cm. This is nonspecific, worrisome for underlying neoplasm. A follow-up pelvic MRI  with IV contrast is recommended for further characterization. Electronically Signed   By: Rance Burrows M.D.   On: 05/12/2024 13:53    Medications:    buprenorphine-naloxone  1 tablet Sublingual TID   Continuous Infusions:   LOS: 1 day   Enrigue Harvard  [facility]   How to contact the [facility] Attending or Consulting provider 7A - 7P or covering provider during after hours 7P -7A, for this patient?  Check the care team in [facility] and look for a) attending/consulting [facility] provider listed and b) the [facility] team listed Log into [email or web address] and use [facility] universal password to access. If you do not have the password, please contact the hospital operator. Locate the [facility] provider you are looking for under [facility] and page to a number that you can be directly reached. If you still have difficulty reaching the provider, please page the [facility] (Director on Call) for the Hospitalists listed on amion for assistance.  05/14/2024, 11:24 AM

## 2024-05-15 ENCOUNTER — Other Ambulatory Visit (HOSPITAL_COMMUNITY): Payer: Self-pay

## 2024-05-15 DIAGNOSIS — C7951 Secondary malignant neoplasm of bone: Secondary | ICD-10-CM

## 2024-05-15 DIAGNOSIS — I82412 Acute embolism and thrombosis of left femoral vein: Secondary | ICD-10-CM

## 2024-05-15 DIAGNOSIS — C787 Secondary malignant neoplasm of liver and intrahepatic bile duct: Secondary | ICD-10-CM

## 2024-05-15 DIAGNOSIS — I82422 Acute embolism and thrombosis of left iliac vein: Secondary | ICD-10-CM

## 2024-05-15 DIAGNOSIS — C801 Malignant (primary) neoplasm, unspecified: Secondary | ICD-10-CM

## 2024-05-15 LAB — BASIC METABOLIC PANEL WITH GFR
Anion gap: 7 (ref 5–15)
BUN: 10 mg/dL (ref 6–20)
CO2: 27 mmol/L (ref 22–32)
Calcium: 8.5 mg/dL — ABNORMAL LOW (ref 8.9–10.3)
Chloride: 107 mmol/L (ref 98–111)
Creatinine, Ser: 0.84 mg/dL (ref 0.61–1.24)
GFR, Estimated: >60 mL/min (ref >60)
Glucose, Bld: 93 mg/dL (ref 70–99)
Potassium: 4.4 mmol/L (ref 3.5–5.1)
Sodium: 141 mmol/L (ref 135–145)

## 2024-05-15 LAB — CBC
HCT: 30.8 % — ABNORMAL LOW (ref 39.0–52.0)
Hemoglobin: 9.6 g/dL — ABNORMAL LOW (ref 13.0–17.0)
MCH: 28.8 pg (ref 26.0–34.0)
MCHC: 31.2 g/dL (ref 30.0–36.0)
MCV: 92.5 fL (ref 80.0–100.0)
Platelets: 102 10*3/uL — ABNORMAL LOW (ref 150–400)
RBC: 3.33 MIL/uL — ABNORMAL LOW (ref 4.22–5.81)
RDW: 14.3 % (ref 11.5–15.5)
WBC: 4.2 10*3/uL (ref 4.0–10.5)
nRBC: 0 % (ref 0.0–0.2)

## 2024-05-15 LAB — HEPARIN LEVEL (UNFRACTIONATED)
Heparin Unfractionated: 0.1 [IU]/mL — ABNORMAL LOW (ref 0.30–0.70)
Heparin Unfractionated: 0.24 [IU]/mL — ABNORMAL LOW (ref 0.30–0.70)

## 2024-05-15 LAB — PSA: Prostatic Specific Antigen: 0.74 ng/mL (ref 0.00–4.00)

## 2024-05-15 MED ORDER — OXYCODONE HCL 5 MG PO TABS
5.0000 mg | ORAL_TABLET | ORAL | Status: DC | PRN
  Administered 2024-05-15 (×3): 5 mg via ORAL
  Filled 2024-05-15 (×3): qty 1

## 2024-05-15 MED ORDER — OXYCODONE HCL 5 MG PO TABS
5.0000 mg | ORAL_TABLET | Freq: Four times a day (QID) | ORAL | 0 refills | Status: DC | PRN
Start: 2024-05-15 — End: 2024-09-01
  Filled 2024-05-15: qty 15, 4d supply, fill #0

## 2024-05-15 MED ORDER — APIXABAN 5 MG PO TABS
10.0000 mg | ORAL_TABLET | Freq: Once | ORAL | Status: AC
  Administered 2024-05-15: 10 mg via ORAL
  Filled 2024-05-15: qty 2

## 2024-05-15 MED ORDER — APIXABAN (ELIQUIS) VTE STARTER PACK (10MG AND 5MG)
ORAL_TABLET | ORAL | 0 refills | Status: DC
  Filled 2024-05-15: qty 74, 30d supply, fill #0

## 2024-05-15 NOTE — Evaluation (Signed)
 Physical Therapy Evaluation Patient Details Name: Carlos Sullivan MRN: [id number] DOB: 05/23/1971 Today's Date: 05/15/2024  History of Present Illness  53 y.o. 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 Past medical history significant of umbilical hernia repair and questionable colon cancer/lesion in 2018 at [facility].  Patient was a prisoner at that time so unable to access records  Clinical Impression  Pt is independent with mobility, he ambulated 450' without an assistive device, no loss of balance. Pt denied numbness, tingling, buckling of BLEs. He is at baseline with mobility, PT signing off. Pt was encouraged to walk in halls at least TID to minimize deconditioning during hospitalization.         If plan is discharge home, recommend the following:     Can travel by private vehicle        Equipment Recommendations None recommended by PT  Recommendations for Other Services       Functional Status Assessment Patient has not had a recent decline in their functional status     Precautions / Restrictions Precautions Precautions: None Recall of Precautions/Restrictions: Intact Precaution/Restrictions Comments: pt denies falls in past 6 months Restrictions Weight Bearing Restrictions Per Provider Order: No      Mobility  Bed Mobility Overal bed mobility: Modified Independent             General bed mobility comments: HOB up, used rail    Transfers Overall transfer level: Independent Equipment used: None                    Ambulation/Gait Ambulation/Gait assistance: Independent Gait Distance (Feet): 450 Feet Assistive device: None Gait Pattern/deviations: WFL(Within Functional Limits) Gait velocity: WNL     General Gait Details: steady without assistive device, no loss of balance  Stairs            Wheelchair  Mobility     Tilt Bed    Modified Rankin (Stroke Patients Only)       Balance Overall balance assessment: Independent                                           Pertinent Vitals/Pain Pain Assessment Pain Assessment: 0-10 Pain Score: 10-Worst pain ever Pain Location: R sciatica Pain Descriptors / Indicators: Radiating Pain Intervention(s): Limited activity within patient's tolerance, Monitored during session, Premedicated before session    Home Living Family/patient expects to be discharged to:: Other (Comment) (halfway house)             Alternate Level Stairs-Number of Steps: flight Home Layout: Two level Home Equipment: None      Prior Function Prior Level of Function : Independent/Modified Independent             Mobility Comments: walks without AD, denies falls in past 6 months ADLs Comments: independent     Extremity/Trunk Assessment   Upper Extremity Assessment Upper Extremity Assessment: Overall WFL for tasks assessed    Lower Extremity Assessment Lower Extremity Assessment: Overall WFL for tasks assessed (sensation intact to light touch BLEs)    Cervical / Trunk Assessment Cervical / Trunk Assessment: Normal  Communication   Communication Communication: No apparent difficulties    Cognition Arousal: Alert Behavior During Therapy: WFL for tasks assessed/performed   PT - Cognitive  impairments: No apparent impairments                         Following commands: Intact       Cueing       General Comments      Exercises     Assessment/Plan    PT Assessment Patient does not need any further PT services  PT Problem List         PT Treatment Interventions      PT Goals (Current goals can be found in the Care Plan section)  Acute Rehab PT Goals PT Goal Formulation: All assessment and education complete, DC therapy    Frequency       Co-evaluation               AM-PAC PT "6 Clicks"  Mobility  Outcome Measure Help needed turning from your back to your side while in a flat bed without using bedrails?: None Help needed moving from lying on your back to sitting on the side of a flat bed without using bedrails?: None Help needed moving to and from a bed to a chair (including a wheelchair)?: None Help needed standing up from a chair using your arms (e.g., wheelchair or bedside chair)?: None Help needed to walk in hospital room?: None Help needed climbing 3-5 steps with a railing? : None 6 Click Score: 24    End of Session Equipment Utilized During Treatment: Gait belt Activity Tolerance: Patient tolerated treatment well Patient left: in bed;with call bell/phone within reach Nurse Communication: Mobility status      Time: 1478-2956 PT Time Calculation (min) (ACUTE ONLY): 13 min   Charges:   PT Evaluation $PT Eval Low Complexity: 1 Low   PT General Charges $$ ACUTE PT VISIT: 1 Visit         Daymon Hamid PT 05/15/2024  Acute Rehabilitation Services  Office [phone]

## 2024-05-15 NOTE — TOC Initial Note (Signed)
 Transition of Care [facility]) - Initial/Assessment Note    Patient Details  Name: ARHAM SYMMONDS MRN: [id number] Date of Birth: 1971/02/13  Transition of [facility]) CM/SW Contact:    Amaryllis Junior, LCSW Phone Number: 05/15/2024, 11:23 AM  Clinical Narrative:                 Pt continues medical workup. PT eval pending. TOC following for dc plans.         Patient Goals and CMS Choice Patient states their goals for this hospitalization and ongoing recovery are:: return home          Expected Discharge Plan and Services In-house Referral: NA Discharge Planning Services: NA   Living arrangements for the past 2 months: Boarding House                 DME Arranged: N/A DME Agency: NA       HH Arranged: NA HH Agency: NA        Prior Living Arrangements/Services Living arrangements for the past 2 months: [employer] Lives with:: Roommate                   Activities of Daily Living   ADL Screening (condition at time of admission) Independently performs ADLs?: Yes (appropriate for developmental age) Is the patient deaf or have difficulty hearing?: No Does the patient have difficulty seeing, even when wearing glasses/contacts?: No Does the patient have difficulty concentrating, remembering, or making decisions?: No  Permission Sought/Granted                  Emotional Assessment              Admission diagnosis:  Sacral mass [M53.3] Edema, unspecified type [R60.9] Deep vein thrombosis (DVT) of femoral vein of left lower extremity, unspecified chronicity (HCC) [I82.412] Metastatic malignant neoplasm, unspecified site [facility]) [C79.9] Patient Active Problem List   Diagnosis Date Noted   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   PCP:  Patient, [name] Pharmacy:   [facility] [phone] - [place], [place] - [address] AT [facility] [address] [place] [address] Phone: [phone] Fax: [phone]  [facility]  [id number] [place], [place] - [address] AT [facility] OF [address] [place] [address] Phone: [phone] Fax: [phone]     Social Drivers of Health (SDOH) Social History: SDOH Screenings   Food Insecurity: Food Insecurity Present (05/13/2024)  Housing: Low Risk  (05/13/2024)  Transportation Needs: Unmet Transportation Needs (05/13/2024)  Utilities: At Risk (05/13/2024)  Tobacco Use: High Risk (05/13/2024)   SDOH Interventions:     Readmission Risk Interventions    05/15/2024    9:57 AM  Readmission Risk Prevention Plan  Post Dischage Appt Complete  Medication Screening Complete  Transportation Screening Complete

## 2024-05-15 NOTE — Progress Notes (Signed)
 Assessment unchanged. Pt verbalized understanding of dc instructions through teach back including medications to resume as well as dc meds, follow up care w/Oncologist and when to call MD. Discharged to front entrance by RN.

## 2024-05-15 NOTE — Plan of Care (Signed)

## 2024-05-15 NOTE — Progress Notes (Signed)
 PHARMACY - ANTICOAGULATION CONSULT NOTE  Pharmacy Consult for Heparin  Indication: DVT  Allergies  Allergen Reactions   Bee Venom Anaphylaxis    Patient Measurements: Height: 6\' 3"  (190.5 cm) Weight: 98 kg (216 lb 0.8 oz) IBW/kg (Calculated) : 84.5 HEPARIN  DW (KG): 98  Vital Signs: Temp: 97.6 F (36.4 C) (05/17 0627) Temp Source: Oral (05/17 0627) BP: 111/68 (05/17 0627) Pulse Rate: 57 (05/17 0627)  Labs: Recent Labs    05/13/24 0945 05/13/24 1701 05/14/24 0117 05/15/24 0520  HGB 10.3*  --  10.3* 9.6*  HCT 31.4*  --  33.0* 30.8*  PLT 129*  --  126* 102*  LABPROT 14.9  --   --   --   INR 1.2  --   --   --   HEPARINUNFRC  --  0.30 0.40 0.10*  CREATININE 0.68  --  0.72 0.84    Estimated Creatinine Clearance: 125.7 mL/min (by C-G formula based on SCr of 0.84 mg/dL).   Medical History: Past Medical History:  Diagnosis Date   Cancer (HCC)    Hyperlipemia    Hypertension     Medications:  Medications Prior to Admission  Medication Sig Dispense Refill Last Dose/Taking   Buprenorphine  HCl-Naloxone  HCl 8-2 MG FILM Place 1 Film under the tongue in the morning, at noon, and at bedtime.   05/13/2024   ibuprofen  (ADVIL ) 800 MG tablet Take 800 mg by mouth in the morning and at bedtime.   05/13/2024   losartan (COZAAR) 25 MG tablet Take 25 mg by mouth daily.   05/13/2024    Assessment: 53 y/o M  presented to ED 5/14 with L leg swelling. 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    Today, 05/15/24: Heparin  was resumed at prior rate of 1750 units/hr this morning at 03 am ( 12 hrs after sacral biopsy per IR orders) Heparin  level ordered for 0930 am ( 6 hrs after started) but drawn at 05 am = 0.1> very low after 2 hrs of drip with no bolus> Not steady state Heparin  level drawn @ 1125 am = 0.24 - slightly below goal on 1750 units/hr Hgb 9.6> stable, plts 10-2--low, stable  - No s/sx of bleeding  reported per RN & not infusion related issues   Goal of Therapy:  Heparin  level 0.3-0.7 units/ml Monitor platelets by anticoagulation protocol: Yes   Plan:  increase heparin  drip to 1850 units/hr  and check heparin  level in 6 hours Monitor heparin  level, CBC, and s/sx of bleeding daily F/u transition to PO anticoagulation   Rubie Corona, Pharm.D Use secure chat for questions 05/15/2024 12:51 PM

## 2024-05-15 NOTE — Discharge Summary (Signed)
 Physician Discharge Summary   Carlos Sullivan: Carlos Sullivan MRN: [id number] DOB: 1971/07/07  Admit date:     05/13/2024  Discharge date: 05/15/24  Discharge Physician: Ephriam Hashimoto   PCP: Carlos Sullivan, [name]     Recommendations at discharge:  Follow up with Oncology Dr. Randye Buttner Dr. Randye Buttner: Please arrange follow up this week after biopsy results Please follow up CEA, PSA, MM panel, FLCs, AFP and [id number] Please advise Carlos Sullivan on proper duration of anticoagulation      Discharge Diagnoses: Principal Problem:   Sacral mass      Hospital Course: 53 y.o. M with HTN, OUD, and unclear history of intestinal mass in 2018 who presented for leg swelling.  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  The mass is symptomatic with sciatic-type pain but he had no cauda equina syndrome.  Admitted to medicine service for work up.     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  Pathology pending at the time of discharge.  The case was discussed with Dr. Randye Buttner, who ordered laboratory testing to confirm primary and will arrange follow up after biopsy results this week.    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  This was reviewed carefully by radiology and vascular surgery, and it is unclear if this is May Turner syndrome, or just from inflammation from his tumor.  There does not appear to be bulky tumor compressing the iliac veins.  Vascular surgery evaluated the Carlos Sullivan, discussed thrombectomy, but the Carlos Sullivan is improving and would prefer to avoid thrombectomy at this time. - Transition to Eliquis - Follow-up with hematology/oncology    Essential hypertension Stable on losartan  Opioid use disorder Carlos Sullivan tolerated oxycodone in addition to Suboxone  here.  Risk and  benefits of temporary oxycodone while his clot resolves were discussed, Carlos Sullivan agrees.  His oxycodone will be kept in controlled area at his halfway house and distributed to him.            The [place]  Controlled Substances Registry was reviewed for this Carlos Sullivan prior to discharge.  Consultants: Oncology Interventional radiology Vascular surgery  Procedures performed: CT angiogram abdomen and pelvis MRI pelvis CT-guided biopsy of the vertebrae  Disposition: Home Diet recommendation:  Regular diet  DISCHARGE MEDICATION: Allergies as of 05/15/2024       Reactions   Bee Venom Anaphylaxis        Medication List     STOP taking these medications    ibuprofen  800 MG tablet Commonly known as: ADVIL        TAKE these medications    Buprenorphine  HCl-Naloxone  HCl 8-2 MG Film Place 1 Film under the tongue in the morning, at noon, and at bedtime.   Eliquis DVT/PE Starter Pack Generic drug: Apixaban Starter Pack (10mg  and 5mg ) Take as directed on package: start with two-5mg  tablets twice daily for 7 days. On day 8, switch to one-5mg  tablet twice daily.   losartan 25 MG tablet Commonly known as: COZAAR Take 25 mg by mouth daily.   oxyCODONE 5 MG immediate release tablet Commonly known as: Oxy IR/ROXICODONE Take 1 tablet (5 mg total) by mouth every 6 (six) hours as needed for breakthrough pain.        Follow-up Information     Pasam, Avinash, MD Follow up.   Specialty:  Oncology Contact information: [address] [place] [address] [phone]                 Discharge Instructions     Discharge instructions   Complete by: As directed    **IMPORTANT DISCHARGE INSTRUCTIONS**   From Dr. 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  The cancer was biopsied. You need to follow up with the [facility]  In the To Do section below, you will  see contact information for Dr. Randye Buttner from the [facility].  He is following your biopsy and will call to arrange follow up this week.  If you have questions, you can try calling that number.  In the meantime, you were found to have a clot in your leg.  Take apixaban/Eliquis There is a 1 week load-in period of two tabs twice daily You got the first dose here, continue your prescription tomorrow morning  Take apixaban 10 mg (two tabs) twice daily for the first week After the first week, on Sunday 5/25, reduce to one tab twice daily  Ask Dr. Randye Buttner how long you should take this, or if he recommends taking it from now on  If you notice bloody bowel movements, or black and "tar-like" bowel movements, call a doctor right away  For pain in the leg, use the compression stocking If your Suboxone  doesn't relieve the pain when combined with acetaminophen /Tylenol  1000 mg (two tabs) then take oxycodone 5 mg   Avoid NSAIDs in excess while on the blood thinner   Increase activity slowly   Complete by: As directed        Discharge Exam: Filed Weights   05/13/24 0934  Weight: 98 kg    General: Pt is alert, awake, not in acute distress Cardiovascular: RRR, nl S1-S2, no murmurs appreciated.  The left leg is swollen, has pitting edema up to the groin. Respiratory: Normal respiratory rate and rhythm.  CTAB without rales or wheezes. Abdominal: Abdomen soft and non-tender.  No distension or HSM.   Neuro/Psych: Strength symmetric in upper and lower extremities.  Neurologically intact in the right and left leg bilaterally.  Judgment and insight appear normal.   Condition at discharge: good  The results of significant diagnostics from this hospitalization (including imaging, microbiology, ancillary and laboratory) are listed below for reference.   Imaging Studies: CT CHEST ABDOMEN PELVIS W CONTRAST Result Date: 05/13/2024 CLINICAL DATA:  Metastatic disease evaluation. History of sacral mass. *  Tracking Code: BO * EXAM: CT CHEST, ABDOMEN, AND PELVIS WITH CONTRAST TECHNIQUE: Multidetector CT imaging of the chest, abdomen and pelvis was performed following the standard protocol during bolus administration of intravenous contrast. RADIATION DOSE REDUCTION: This exam was performed according to the departmental dose-optimization program which includes automated exposure control, adjustment of the mA and/or kV according to Carlos Sullivan size and/or use of iterative reconstruction technique. CONTRAST:  OMNIPAQUE  IOHEXOL  300 MG/ML  SOLN COMPARISON:  MRI lumbar spine, MRI pelvis and CT angio runoff from 05/12/2024. FINDINGS: CT CHEST FINDINGS Cardiovascular: Normal cardiac size. No pericardial effusion. No aortic aneurysm. Mediastinum/Nodes: Visualized thyroid  gland appears grossly unremarkable. No solid / cystic mediastinal masses. The esophagus is nondistended precluding optimal assessment. There are few mildly prominent mediastinal lymph nodes, which do not meet the size criteria for lymphadenopathy and though indeterminate most likely benign in etiology. No axillary or hilar lymphadenopathy by size criteria. Lungs/Pleura:  The central tracheo-bronchial tree is patent. No mass or consolidation. No pleural effusion or pneumothorax. No suspicious lung nodules. Musculoskeletal: The visualized soft tissues of the chest wall are grossly unremarkable. No suspicious osseous lesions. There are mild multilevel degenerative changes in the visualized spine. CT ABDOMEN PELVIS FINDINGS Hepatobiliary: The liver is normal in size. Non-cirrhotic configuration. There are at least 2, ill-defined hypoattenuating masses in the right hepatic lobe, segment 5, which are incompletely characterized on the current exam but suspicious. Further evaluation fifth multiphasic contrast-enhanced MRI abdomen as per liver mass protocol is recommended. No intrahepatic or extrahepatic bile duct dilation. Gallbladder is surgically absent. Pancreas:  Unremarkable. No pancreatic ductal dilatation or surrounding inflammatory changes. Spleen: Within normal limits. No focal lesion. Adrenals/Urinary Tract: Adrenal glands are unremarkable. No suspicious renal mass. No hydronephrosis. No renal or ureteric calculi. Urinary bladder is under distended, precluding optimal assessment. However, no large mass or stones identified. No perivesical fat stranding. Stomach/Bowel: No disproportionate dilation of the small or large bowel loops. No evidence of abnormal bowel wall thickening or inflammatory changes. The appendix is unremarkable. Bowel anastomotic suture noted in the right lower quadrant. Vascular/Lymphatic: No ascites or pneumoperitoneum. No abdominal or pelvic lymphadenopathy, by size criteria. No aneurysmal dilation of the major abdominal arteries. Reproductive: Normal size prostate. Symmetric seminal vesicles. Other: Periumbilical midline surgical scar noted. There is mild anasarca. There are soft tissue density areas in the anterior abdominal wall subcutaneous tissue, most likely due to medication injections. Musculoskeletal: There is focal expansion of the upper sacral spinal canal with remodeling of the upper sacrum. There are sclerotic areas within the upper sacrum. 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 Please refer to MRI lumbar spine report from 05/12/2024 for details. IMPRESSION: 1. There are at least 2, ill-defined hypoattenuating masses in the right hepatic lobe, segment 5, which are incompletely characterized on the current exam but suspicious. Further evaluation fifth multiphasic contrast-enhanced MRI abdomen as per liver mass protocol is recommended. 2. No metastatic disease identified within the chest. 3. Redemonstration of Carlos Sullivan's known upper sacral mass. Please refer to MRI lumbar spine and MRI pelvis report of from yesterday for details. 4. Multiple other nonacute observations, as described  above. Electronically Signed   By: Beula Brunswick M.D.   On: 05/13/2024 15:13   MR PELVIS W WO CONTRAST Result Date: 05/12/2024 CLINICAL DATA:  Sacral mass seen on recent CT scan and lumbar spine MRI. EXAM: MRI PELVIS WITHOUT AND WITH CONTRAST TECHNIQUE: Multiplanar multisequence MR imaging of the pelvis was performed both before and after administration of intravenous contrast. CONTRAST:  10mL GADAVIST  GADOBUTROL  1 MMOL/ML IV SOLN COMPARISON:  CT scan 05/12/2024 and MRI lumbar spine 05/12/2024 FINDINGS: Urinary Tract: The bladder is unremarkable. No bladder mass or calculi. Bowel: Moderate pericolonic inflammatory changes but no rectal or sigmoid colon mass is identified. The visualized pelvic small bowel loops are unremarkable. Vascular/Lymphatic: The left common femoral vein, external iliac vein and left femoral vein are thrombosed. Associated surrounding inflammatory changes. This likely accounts for the Carlos Sullivan's massive subcutaneous edema involving the left hip and thigh region. No overt lymphadenopathy the. Reproductive:  The prostate gland seminal vesicles are unremarkable. Other: Extensive tumor occupying the spinal canal as noted on the recent MRI lumbar spine. The tumor begins at the L5-S1 disc space and extends down to the bottom of S3. The spinal canal is markedly widened and tumor extends out through the sacral neural foramen. There is also involvement of all  of the bony sacral segments with tumor and lumbar spine lesions are also noted. However, there is no destructive bony changes or obvious cortical erosion. There is also enhancing presacral tumor at the same levels. No associated lymphadenopathy but lymphoma is certainly a possibility. Musculoskeletal: Infiltrating lumbar and sacral lesions without destructive cortical changes possibly reflecting a process such as lymphoma. IMPRESSION: 1. 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 The spinal canal is markedly widened and tumor extends out  through the sacral neural foramen. There is also involvement of all of the bony sacral segments with tumor and lumbar spine lesions are also noted. However, there are no destructive bony changes or obvious cortical erosion. There is also enhancing presacral tumor at the same levels. No associated lymphadenopathy but lymphoma is certainly a possibility. 2. The left common femoral vein, external iliac vein and left femoral vein are thrombosed. Associated surrounding inflammatory changes. This likely accounts for the Carlos Sullivan's massive subcutaneous edema involving the left hip and thigh region. Electronically Signed   By: Marrian Siva M.D.   On: 05/12/2024 21:36   MR Lumbar Spine W Wo Contrast Result Date: 05/12/2024 CLINICAL DATA:  Metastatic disease evaluation, lower back pain radiating into the left glute muscles. Left leg swelling. EXAM: MRI LUMBAR SPINE WITHOUT AND WITH CONTRAST TECHNIQUE: Multiplanar and multiecho pulse sequences of the lumbar spine were obtained without and with intravenous contrast. CONTRAST:  10mL GADAVIST  GADOBUTROL  1 MMOL/ML IV SOLN COMPARISON:  None Available. FINDINGS: Segmentation:  Standard. Alignment: Lumbar lordosis is maintained. Grade 1 anterolisthesis of L5 on S1. Vertebrae: There is a T1 hypointense, stir hyperintense lesion in the right posterior aspect of T12 which demonstrates mild enhancement. Lesion partially extends into the right pedicle of T12. Additional similar appearing lesion within the right anterior inferior aspect of the L5 vertebra. Irregularity of the L4 superior endplate with associated mild edema and enhancement likely reflecting Schmorl's nodes. Additional Modic type 1 degenerative endplate changes anteriorly at L3-4 with mild associated discogenic edema. Additional remote Schmorl's nodes in the lower thoracic and upper lumbar spine. Vertebral body heights otherwise maintained. There is a mildly enhancing STIR hyperintense mass along the dorsal aspect of the  sacrum which measures 8.1 x 2.6 cm in maximum dimensions on sagittal images. There is additional signal abnormality within the visualized S1-S3 sacral vertebra with associated enhancement. Conus medullaris and cauda equina: Conus extends to the L1-2 level. Conus and cauda equina appear normal. Paraspinal and other soft tissues: There is presacral edema and enhancement which is partially visualized. The paraspinal musculature is otherwise unremarkable. Disc levels: T12-L1: Minimal disc bulge. Facet arthrosis indents the dorsal thecal sac. No significant spinal canal stenosis. No significant foraminal stenosis. L1-2: Mild facet arthrosis indents the dorsal thecal sac. No significant spinal canal stenosis. No significant foraminal stenosis. L2-3: Minimal disc bulge which indents the ventral thecal sac with lateral recess narrowing without impingement upon the traversing nerve roots. Moderate facet arthrosis indents the dorsal thecal sac. Mild spinal canal stenosis. There is mild foraminal stenosis on the right. L3-4: Disc desiccation and moderate disc height loss. Diffuse disc bulge eccentric to the right which indents the ventral thecal sac with lateral recess narrowing more pronounced on the right. There is likely impingement upon the traversing right L4 nerve roots. Additional right paracentral component of disc extrusion with approximately 4 mm cranial migration of disc contents. Mild facet arthrosis indents the dorsal thecal sac. Mild spinal canal stenosis. There is moderate bilateral foraminal stenosis. Disc bulge likely contacts the  exiting bilateral L3 nerve roots. L4-5: Small disc bulge resulting in lateral recess narrowing without significant impingement upon the traversing nerve roots. Moderate to severe facet arthrosis which indents the dorsal thecal sac. Mild spinal canal stenosis. There is mild foraminal stenosis on the left. L5-S1: Grade 1 anterolisthesis with partial uncovering of the disc. Diffuse disc  bulge indents the ventral thecal sac with lateral recess narrowing without significant impingement upon the traversing nerve roots. Severe facet arthrosis indents the dorsal thecal sac. There is thickening of the ligamentum flavum which appears edematous with associated enhancement. Mild-to-moderate spinal canal stenosis. There is moderate bilateral foraminal stenosis. Disc bulge abuts the bilateral L5 nerve roots. Mass along the dorsal aspect of the sacrum abuts the ventral and inferior aspect of the thecal sac just below the level of the L5-S1 disc without evidence of extension into the thecal sac. The mass extends into the bilateral S1 sacral foramina in appears to displace the sacral nerve roots superiorly within the foramina. The tumor appears to envelop the nerve roots. Enhancement along the visualized sacral nerve roots concerning for perineural spread. 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 Large soft tissue mass along the dorsal aspect of the sacrum extending from the superior margin of S1 to S3. Associated bone marrow signal abnormality and enhancement of the visualized S1 through S3 vertebra concerning for additional sites of osseous metastatic disease. Soft tissue mass extends into the visualized S1 sacral foramina and appears to envelop the bilateral S1 nerve roots. Partially visualized presacral edema and enhancement. Degenerative changes of the lumbar spine as above. No high-grade spinal canal stenosis. Lateral recess narrowing at multiple levels. Disc bulge at L3-4 likely results in impingement of the traversing right L4 nerve roots. Multilevel foraminal stenosis, greatest and moderate bilaterally at L3-4 and L5-S1. Disc bulges at L3-4 and L5-S1 likely abut the exiting L3 and L5 nerve roots respectively. Electronically Signed   By: Denny Flack M.D.   On: 05/12/2024 20:25   CT ANGIO AO+BIFEM W & OR WO CONTRAST Result Date:  05/12/2024 CLINICAL DATA:  Left leg swelling, ?phlegmasia EXAM: CT ANGIOGRAPHY CHEST WITH CONTRAST TECHNIQUE: Multidetector CT imaging of the abdomen and pelvis with run-off to both lower extremities was performed using the standard protocol during bolus administration of intravenous contrast. Multiplanar CT image reconstructions and MIPs were obtained to evaluate the vascular anatomy. RADIATION DOSE REDUCTION: This exam was performed according to the departmental dose-optimization program which includes automated exposure control, adjustment of the mA and/or kV according to Carlos Sullivan size and/or use of iterative reconstruction technique. CONTRAST:  OMNIPAQUE  IOHEXOL  350 MG/ML SOLN COMPARISON:  None Available. FINDINGS: VASCULAR Aorta: No aneurysm or aortic dissection. No acute thrombus. Celiac: Patent without aneurysm, dissection, or hemodynamically significant stenosis. SMA: Patent without aneurysm, dissection, or hemodynamically significant stenosis. Renals: Patent without aneurysm, dissection, or hemodynamically significant stenosis. IMA: Patent without aneurysm, dissection, or hemodynamically significant stenosis. RIGHT Lower Extremity Inflow: Common, internal and external iliac arteries are patent without aneurysm, acute thrombus, or dissection. No plaque or hemodynamically significant stenosis. Outflow: Common, superficial and profunda femoral, and the popliteal arteries are patent without acute thrombus, aneurysm, or dissection. No plaque or hemodynamically significant stenosis. Runoff: Patent three vessel runoff to the foot. LEFT Lower Extremity Inflow: Common, internal and external iliac arteries are patent without aneurysm, acute thrombus, or dissection. No plaque or hemodynamically significant stenosis. Outflow: Common, superficial and profunda femoral, and the popliteal arteries are patent without acute thrombus,  aneurysm, or dissection. No plaque or hemodynamically significant stenosis. Runoff:  Patent three vessel runoff to the foot. Veins: Not evaluated due to the phase of contrast. Review of the MIP images confirms the above findings. NON-VASCULAR Lower chest: No focal airspace consolidation or pleural effusion. Hepatobiliary: No mass.Cholecystectomy.No intrahepatic or extrahepatic biliary ductal dilation. Pancreas: No mass or main ductal dilation.No peripancreatic inflammation or fluid collection. Spleen: Normal size. No mass. Adrenals/Urinary Tract: No adrenal masses. No renal mass. No hydronephrosis or nephrolithiasis. Circumferential wall thickening of the urinary bladder. Stomach/Bowel: The stomach is decompressed without focal abnormality. No small bowel wall thickening or inflammation. No small bowel obstruction. The appendix was not visualized. No right lower quadrant or pericecal inflammatory changes to suggest acute appendicitis. Mucosal hyperenhancement and mild wall thickening in the rectosigmoid colon. Presacral stranding also present. Vascular/Lymphatic: Mildly enlarged left external iliac chain lymph node measuring 1.3 cm (axial 219). Reproductive: No prostatomegaly.no free pelvic fluid. Other: No pneumoperitoneum, ascites, or mesenteric inflammation. Musculoskeletal: No acute fracture. Soft tissue mass within the spinal canal causing bony expansion and overlying destruction of the sacrum, centered at S2, measuring approximately 3.1 x 5.3 x 5.2 cm (axial 196). This causes significant narrowing of the S2 neural foramina.Moderate diffuse subcutaneous edema throughout the left leg with diffuse skin thickening present. Multilevel degenerative disc disease of the spine. IMPRESSION: VASCULAR 1. No aortic aneurysm or aortic dissection. 2. No acute thrombus or hemodynamically significant stenosis within the inflow or outflow vessels to either leg. Patent three-vessel runoff to the feet bilaterally. NON-VASCULAR 1. Mucosal hyperenhancement and mild wall thickening of the rectosigmoid colon with  presacral stranding, which may reflect changes of an infectious or inflammatory proctocolitis. 2. Soft tissue mass within the spinal canal causing bony expansion and overlying destruction of the sacral bone, centered at S2, measuring approximately 3.1 x 5.3 x 5.2 cm. This is nonspecific, worrisome for underlying neoplasm. A follow-up pelvic MRI with IV contrast is recommended for further characterization. Electronically Signed   By: Rance Burrows M.D.   On: 05/12/2024 13:53   VAS US  LOWER EXTREMITY VENOUS (DVT) (7a-7p) Result Date: 05/12/2024  Lower Venous DVT Study Carlos Sullivan Name:  GREEN QUINCY  Date of Exam:   05/12/2024 Medical Rec #: [id number]         Accession #:    [id number] Date of Birth: [birth year]          Carlos Sullivan Gender: M Carlos Sullivan Age:   41 years Exam Location:  [facility] Procedure:      VAS US  LOWER EXTREMITY VENOUS (DVT) Referring Phys: Russella Courts --------------------------------------------------------------------------------  Indications: Swelling.  Risk Factors: None identified. Limitations: Poor ultrasound/tissue interface. Comparison Study: No prior studies. Performing Technologist: Lerry Ransom RVT  Examination Guidelines: A complete evaluation includes B-mode imaging, spectral Doppler, color Doppler, and power Doppler as needed of all accessible portions of each vessel. Bilateral testing is considered an integral part of a complete examination. Limited examinations for reoccurring indications may be performed as noted. The reflux portion of the exam is performed with the Carlos Sullivan in reverse Trendelenburg.  +-----+---------------+---------+-----------+----------+--------------+ RIGHTCompressibilityPhasicitySpontaneityPropertiesThrombus Aging +-----+---------------+---------+-----------+----------+--------------+ CFV  Full           Yes      Yes                                 +-----+---------------+---------+-----------+----------+--------------+    +---------+---------------+---------+-----------+----------+-------------------+ LEFT     CompressibilityPhasicitySpontaneityPropertiesThrombus Aging      +---------+---------------+---------+-----------+----------+-------------------+  CFV      Full           Yes      Yes                                      +---------+---------------+---------+-----------+----------+-------------------+ SFJ      Full                                                             +---------+---------------+---------+-----------+----------+-------------------+ FV Prox  Full                                                             +---------+---------------+---------+-----------+----------+-------------------+ FV Mid                  Yes      Yes                                      +---------+---------------+---------+-----------+----------+-------------------+ FV Distal               Yes      Yes                                      +---------+---------------+---------+-----------+----------+-------------------+ PFV      Full                                                             +---------+---------------+---------+-----------+----------+-------------------+ POP      Full           Yes      Yes                                      +---------+---------------+---------+-----------+----------+-------------------+ PTV      Full                                                             +---------+---------------+---------+-----------+----------+-------------------+ PERO                                                  Not well visualized +---------+---------------+---------+-----------+----------+-------------------+    Summary: RIGHT: - No evidence of common femoral vein obstruction.   LEFT: - There is no evidence of deep vein thrombosis in the lower  extremity. However, portions of this examination were limited- see technologist comments above.  - No cystic  structure found in the popliteal fossa.  *See table(s) above for measurements and observations. Electronically signed by Jimmye Moulds MD on 05/12/2024 at 10:25:20 AM.    Final     Microbiology: No results found for this or any previous visit.  Labs: CBC: Recent Labs  Lab 05/12/24 1154 05/13/24 0945 05/14/24 0117 05/15/24 0520  WBC 5.1 3.9* 4.2 4.2  NEUTROABS 2.9 2.2  --   --   HGB 10.2* 10.3* 10.3* 9.6*  HCT 31.7* 31.4* 33.0* 30.8*  MCV 89.5 88.5 93.2 92.5  PLT 126* 129* 126* 102*   Basic Metabolic Panel: Recent Labs  Lab 05/12/24 0943 05/13/24 0945 05/14/24 0117 05/15/24 0520  NA 139 138 138 141  K 3.8 3.6 3.4* 4.4  CL 109 106 107 107  CO2 24 25 27 27   GLUCOSE 82 95 100* 93  BUN 9 9 10 10   CREATININE 0.72 0.68 0.72 0.84  CALCIUM 8.6* 8.9 8.3* 8.5*   Liver Function Tests: Recent Labs  Lab 05/14/24 0117  AST 13*  ALT 14  ALKPHOS 64  BILITOT 0.7  PROT 6.2*  ALBUMIN 3.1*   CBG: No results for input(s): "GLUCAP" in the last 168 hours.  Discharge time spent: approximately 45 minutes spent on discharge counseling, evaluation of Carlos Sullivan on day of discharge, and coordination of discharge planning with nursing, social work, pharmacy and case management  Signed: Ephriam Hashimoto, MD [facility] 05/15/2024

## 2024-05-15 NOTE — Plan of Care (Signed)
  Problem: Education: Goal: Knowledge of General Education information will improve Description: Including pain rating scale, medication(s)/side effects and non-pharmacologic comfort measures 05/15/2024 1844 by Alford Im, RN Outcome: Adequate for Discharge 05/15/2024 1553 by Alford Im, RN Outcome: Progressing   Problem: Health Behavior/Discharge Planning: Goal: Ability to manage health-related needs will improve 05/15/2024 1844 by Alford Im, RN Outcome: Adequate for Discharge 05/15/2024 1553 by Alford Im, RN Outcome: Progressing   Problem: Clinical Measurements: Goal: Ability to maintain clinical measurements within normal limits will improve 05/15/2024 1844 by Alford Im, RN Outcome: Adequate for Discharge 05/15/2024 1553 by Alford Im, RN Outcome: Progressing Goal: Will remain free from infection 05/15/2024 1844 by Alford Im, RN Outcome: Adequate for Discharge 05/15/2024 1553 by Alford Im, RN Outcome: Progressing Goal: Diagnostic test results will improve 05/15/2024 1844 by Alford Im, RN Outcome: Adequate for Discharge 05/15/2024 1553 by Alford Im, RN Outcome: Progressing Goal: Respiratory complications will improve 05/15/2024 1844 by Alford Im, RN Outcome: Adequate for Discharge 05/15/2024 1553 by Alford Im, RN Outcome: Progressing Goal: Cardiovascular complication will be avoided 05/15/2024 1844 by Alford Im, RN Outcome: Adequate for Discharge 05/15/2024 1553 by Alford Im, RN Outcome: Progressing   Problem: Activity: Goal: Risk for activity intolerance will decrease 05/15/2024 1844 by Alford Im, RN Outcome: Adequate for Discharge 05/15/2024 1553 by Alford Im, RN Outcome: Progressing   Problem: Nutrition: Goal: Adequate nutrition will be maintained 05/15/2024 1844 by Alford Im, RN Outcome: Adequate for Discharge 05/15/2024 1553 by  Alford Im, RN Outcome: Progressing   Problem: Coping: Goal: Level of anxiety will decrease 05/15/2024 1844 by Alford Im, RN Outcome: Adequate for Discharge 05/15/2024 1553 by Alford Im, RN Outcome: Progressing   Problem: Elimination: Goal: Will not experience complications related to bowel motility 05/15/2024 1844 by Alford Im, RN Outcome: Adequate for Discharge 05/15/2024 1553 by Alford Im, RN Outcome: Progressing Goal: Will not experience complications related to urinary retention 05/15/2024 1844 by Alford Im, RN Outcome: Adequate for Discharge 05/15/2024 1553 by Alford Im, RN Outcome: Progressing   Problem: Pain Managment: Goal: General experience of comfort will improve and/or be controlled 05/15/2024 1844 by Alford Im, RN Outcome: Adequate for Discharge 05/15/2024 1553 by Alford Im, RN Outcome: Progressing   Problem: Safety: Goal: Ability to remain free from injury will improve 05/15/2024 1844 by Alford Im, RN Outcome: Adequate for Discharge 05/15/2024 1553 by Alford Im, RN Outcome: Progressing   Problem: Skin Integrity: Goal: Risk for impaired skin integrity will decrease 05/15/2024 1844 by Alford Im, RN Outcome: Adequate for Discharge 05/15/2024 1553 by Alford Im, RN Outcome: Progressing

## 2024-05-15 NOTE — Progress Notes (Signed)
 TOC meds in a secure bag delivered to pt in room & placed in pt's grey mesh duffle bag-  by this RN as directed by pt. primary RN in room at time.

## 2024-05-15 NOTE — Hospital Course (Signed)
 53 y.o. male with medical history significant of umbilical hernia repair and questionable colon cancer/lesion in 2018 at [facility].  Patient was a prisoner at that time so unable to access records in Care Everywhere but he reports they also remove part of his small bowel due to lesion on his intestines.  He reports that the lesion was nonmalignant/slow growing.  Patient initially reported to the ER on 5/13 for left leg swelling worsened over the last 3 weeks but appears he left before being seen.  Patient then came back to the ER on 5/14 where he had an ultrasound of his leg that was negative for DVTs.  He also had CT that showed a tumor on his sacrum  ER plan for a pelvic/lumbar MRI that showed extensive tumor occupying the lower spinal canal.  The spinal canal was markedly widened and tumor extends out through the sacral foramen.  Patient also had no associated lymphadenopathy.  CT scan showed left common femoral vein and iliac vein as well as left femoral vein were thrombosed.  He then again left AGAINST MEDICAL ADVICE due to needing to report back to his halfway house.  He was given a dose of Lovenox .  Patient comes back to the ER on 5/15 where the ER reached out to both neurosurgery and oncology.  Neurosurgery recommends a CT chest abdomen pelvis for metastatic workup and IR guided biopsy for further treatment.  They recommend patient to remain at [facility] for oncology evaluation.

## 2024-05-15 NOTE — Consult Note (Signed)
 [facility] [place]  ASSESSMENT / PLAN: 53 y.o. 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 He has seen symptomatic improvement with anticoagulation. I reviewed his options including endovascular thrombectomy. He prefers to avoid intervention for now, which I think is very reasonable. Continue anticoagulation while cancer is active. Will order compression sock. Please call for questions.  CHIEF COMPLAINT: left leg swelling and pain  HISTORY OF PRESENT ILLNESS: Carlos Sullivan is a 53 y.o. male admitted to the internal medicine service for treatment of left leg swelling over the past 3 weeks.  He was found to have a left iliac and femoral vein DVT in addition to sacral and liver metastasis.  He underwent surgery in 2018 at [facility], but has had his therapy interrupted because of incarceration.  On my evaluation, the patient is a very pleasant middle-age man in no distress. He has no major complaints.  He reports his left leg feels much better.  The swelling is improving with medical therapy.   Past Medical History:  Diagnosis Date   Cancer (HCC)    Hyperlipemia    Hypertension     Past Surgical History:  Procedure Laterality Date   CHOLECYSTECTOMY     HERNIA REPAIR      History reviewed. No pertinent family history.  Social History   Socioeconomic History   Marital status: Divorced    Spouse name: Not on file   Number of children: Not on file   Years of education: Not on file   Highest education level: Not on file  Occupational History   Not on file  Tobacco Use   Smoking status: Every Day   Smokeless tobacco: Not on file  Substance and Sexual Activity   Alcohol use: No   Drug use: No   Sexual activity: Not on file  Other Topics Concern   Not on file  Social History Narrative   Not on file   Social Drivers of Health   Financial Resource Strain: Not on file  Food Insecurity: Food Insecurity Present (05/13/2024)    Hunger Vital Sign    Worried About Running Out of Food in the Last Year: Often true    Ran Out of Food in the Last Year: Often true  Transportation Needs: Unmet Transportation Needs (05/13/2024)   PRAPARE - Administrator, Civil Service (Medical): Yes    Lack of Transportation (Non-Medical): Yes  Physical Activity: Not on file  Stress: Not on file  Social Connections: Not on file  Intimate Partner Violence: Not At Risk (05/13/2024)   Humiliation, Afraid, Rape, and Kick questionnaire    Fear of Current or Ex-Partner: No    Emotionally Abused: No    Physically Abused: No    Sexually Abused: No    Allergies  Allergen Reactions   Bee Venom Anaphylaxis    Current Facility-Administered Medications  Medication Dose Route Frequency Provider Last Rate Last Admin   acetaminophen  (TYLENOL ) tablet 650 mg  650 mg Oral Q6H PRN Vann, Jessica U, DO   650 mg at 05/15/24 1100   Or   acetaminophen  (TYLENOL ) suppository 650 mg  650 mg Rectal Q6H PRN Katrina Parma U, DO       buprenorphine -naloxone  (SUBOXONE ) 8-2 mg per SL tablet 1 tablet  1 tablet Sublingual TID Vann, Jessica U, DO   1 tablet at 05/15/24 1508   heparin  ADULT infusion 100 units/mL (25000 units/250mL)  1,850 Units/hr Intravenous Continuous Arlyne Bering  T, RPH 18.5 mL/hr at 05/15/24 1321 1,850 Units/hr at 05/15/24 1321   ondansetron  (ZOFRAN ) tablet 4 mg  4 mg Oral Q6H PRN Katrina Parma U, DO       Or   ondansetron  (ZOFRAN ) injection 4 mg  4 mg Intravenous Q6H PRN Katrina Parma U, DO       oxyCODONE (Oxy IR/ROXICODONE) immediate release tablet 5 mg  5 mg Oral Q4H PRN Ephriam Hashimoto, MD   5 mg at 05/15/24 1508    PHYSICAL EXAM Vitals:   05/14/24 1528 05/14/24 2123 05/15/24 0627 05/15/24 1208  BP: 132/76 130/86 111/68 133/75  Pulse: 69 76 (!) 57 65  Resp: 16 18 18 18   Temp: 98.4 F (36.9 C) 98 F (36.7 C) 97.6 F (36.4 C) 98.4 F (36.9 C)  TempSrc: Oral Oral Oral Oral  SpO2: 100% 100% 99% 96%  Weight:       Height:       Middle-age man in no distress Regular rate and rhythm Unlabored breathing Palpable dorsalis pedis pulses bilaterally Pitting edema of the left lower extremity from the foot to the thigh Normal motor and sensory function of the left foot  PERTINENT LABORATORY AND RADIOLOGIC DATA  Most recent CBC    Latest Ref Rng & Units 05/15/2024    5:20 AM 05/14/2024    1:17 AM 05/13/2024    9:45 AM  CBC  WBC 4.0 - 10.5 K/uL 4.2  4.2  3.9   Hemoglobin 13.0 - 17.0 g/dL 9.6  91.4  78.2   Hematocrit 39.0 - 52.0 % 30.8  33.0  31.4   Platelets 150 - 400 K/uL 102  126  129      Most recent CMP    Latest Ref Rng & Units 05/15/2024    5:20 AM 05/14/2024    1:17 AM 05/13/2024    9:45 AM  CMP  Glucose 70 - 99 mg/dL 93  956  95   BUN 6 - 20 mg/dL 10  10  9    Creatinine 0.61 - 1.24 mg/dL 2.13  0.86  5.78   Sodium 135 - 145 mmol/L 141  138  138   Potassium 3.5 - 5.1 mmol/L 4.4  3.4  3.6   Chloride 98 - 111 mmol/L 107  107  106   CO2 22 - 32 mmol/L 27  27  25    Calcium 8.9 - 10.3 mg/dL 8.5  8.3  8.9   Total Protein 6.5 - 8.1 g/dL  6.2    Total Bilirubin 0.0 - 1.2 mg/dL  0.7    Alkaline Phos 38 - 126 U/L  64    AST 15 - 41 U/L  13    ALT 0 - 44 U/L  14     CLINICAL DATA:  Sacral mass seen on recent CT scan and lumbar spine MRI.   EXAM: MRI PELVIS WITHOUT AND WITH CONTRAST   TECHNIQUE: Multiplanar multisequence MR imaging of the pelvis was performed both before and after administration of intravenous contrast.   CONTRAST:  10mL GADAVIST  GADOBUTROL  1 MMOL/ML IV SOLN   COMPARISON:  CT scan 05/12/2024 and MRI lumbar spine 05/12/2024   FINDINGS: Urinary Tract: The bladder is unremarkable. No bladder mass or calculi.   Bowel: Moderate pericolonic inflammatory changes but no rectal or sigmoid colon mass is identified. The visualized pelvic small bowel loops are unremarkable.   Vascular/Lymphatic: The left common femoral vein, external iliac vein and left femoral vein are  thrombosed. Associated surrounding inflammatory changes.  This likely accounts for the patient's massive subcutaneous edema involving the left hip and thigh region. No overt lymphadenopathy the.   Reproductive:  The prostate gland seminal vesicles are unremarkable.   Other: Extensive tumor occupying the spinal canal as noted on the recent MRI lumbar spine. The tumor begins at the L5-S1 disc space and extends down to the bottom of S3. The spinal canal is markedly widened and tumor extends out through the sacral neural foramen. There is also involvement of all of the bony sacral segments with tumor and lumbar spine lesions are also noted. However, there is no destructive bony changes or obvious cortical erosion. There is also enhancing presacral tumor at the same levels. No associated lymphadenopathy but lymphoma is certainly a possibility.   Musculoskeletal: Infiltrating lumbar and sacral lesions without destructive cortical changes possibly reflecting a process such as lymphoma.   IMPRESSION: 1. 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 The spinal canal is markedly widened and tumor extends out through the sacral neural foramen. There is also involvement of all of the bony sacral segments with tumor and lumbar spine lesions are also noted. However, there are no destructive bony changes or obvious cortical erosion. There is also enhancing presacral tumor at the same levels. No associated lymphadenopathy but lymphoma is certainly a possibility. 2. The left common femoral vein, external iliac vein and left femoral vein are thrombosed. Associated surrounding inflammatory changes. This likely accounts for the patient's massive subcutaneous edema involving the left hip and thigh region.     Electronically Signed   By: Marrian Siva M.D.   On: 05/12/2024 21:36  CLINICAL DATA:  Metastatic disease evaluation. History of sacral mass. * Tracking Code: BO *   EXAM: CT CHEST,  ABDOMEN, AND PELVIS WITH CONTRAST   TECHNIQUE: Multidetector CT imaging of the chest, abdomen and pelvis was performed following the standard protocol during bolus administration of intravenous contrast.   RADIATION DOSE REDUCTION: This exam was performed according to the departmental dose-optimization program which includes automated exposure control, adjustment of the mA and/or kV according to patient size and/or use of iterative reconstruction technique.   CONTRAST:  OMNIPAQUE  IOHEXOL  300 MG/ML  SOLN   COMPARISON:  MRI lumbar spine, MRI pelvis and CT angio runoff from 05/12/2024.   FINDINGS: CT CHEST FINDINGS   Cardiovascular: Normal cardiac size. No pericardial effusion. No aortic aneurysm.   Mediastinum/Nodes: Visualized thyroid  gland appears grossly unremarkable. No solid / cystic mediastinal masses. The esophagus is nondistended precluding optimal assessment. There are few mildly prominent mediastinal lymph nodes, which do not meet the size criteria for lymphadenopathy and though indeterminate most likely benign in etiology. No axillary or hilar lymphadenopathy by size criteria.   Lungs/Pleura: The central tracheo-bronchial tree is patent. No mass or consolidation. No pleural effusion or pneumothorax. No suspicious lung nodules.   Musculoskeletal: The visualized soft tissues of the chest wall are grossly unremarkable. No suspicious osseous lesions. There are mild multilevel degenerative changes in the visualized spine.   CT ABDOMEN PELVIS FINDINGS   Hepatobiliary: The liver is normal in size. Non-cirrhotic configuration. There are at least 2, ill-defined hypoattenuating masses in the right hepatic lobe, segment 5, which are incompletely characterized on the current exam but suspicious. Further evaluation fifth multiphasic contrast-enhanced MRI abdomen as per liver mass protocol is recommended. No intrahepatic or extrahepatic bile duct dilation. Gallbladder  is surgically absent.   Pancreas: Unremarkable. No pancreatic ductal dilatation or surrounding inflammatory changes.   Spleen: Within normal limits. No  focal lesion.   Adrenals/Urinary Tract: Adrenal glands are unremarkable. No suspicious renal mass. No hydronephrosis. No renal or ureteric calculi. Urinary bladder is under distended, precluding optimal assessment. However, no large mass or stones identified. No perivesical fat stranding.   Stomach/Bowel: No disproportionate dilation of the small or large bowel loops. No evidence of abnormal bowel wall thickening or inflammatory changes. The appendix is unremarkable. Bowel anastomotic suture noted in the right lower quadrant.   Vascular/Lymphatic: No ascites or pneumoperitoneum. No abdominal or pelvic lymphadenopathy, by size criteria. No aneurysmal dilation of the major abdominal arteries.   Reproductive: Normal size prostate. Symmetric seminal vesicles.   Other: Periumbilical midline surgical scar noted. There is mild anasarca. There are soft tissue density areas in the anterior abdominal wall subcutaneous tissue, most likely due to medication injections.   Musculoskeletal: There is focal expansion of the upper sacral spinal canal with remodeling of the upper sacrum. There are sclerotic areas within the upper sacrum. 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 Please refer to MRI lumbar spine report from 05/12/2024 for details.   IMPRESSION: 1. There are at least 2, ill-defined hypoattenuating masses in the right hepatic lobe, segment 5, which are incompletely characterized on the current exam but suspicious. Further evaluation fifth multiphasic contrast-enhanced MRI abdomen as per liver mass protocol is recommended. 2. No metastatic disease identified within the chest. 3. Redemonstration of patient's known upper sacral mass. Please refer to MRI lumbar spine and MRI pelvis  report of from yesterday for details. 4. Multiple other nonacute observations, as described above.     Electronically Signed   By: Beula Brunswick M.D.   On: 05/13/2024 15:13  Heber Little. Edgardo Goodwill, MD FACS [facility] Phone Number: [phone] 05/15/2024 4:07 PM   Total time spent on preparing this encounter including chart review, data review, collecting history, examining the patient, and coordinating care: 60 minutes.   Portions of this report may have been transcribed using voice recognition software.  Every effort has been made to ensure accuracy; however, inadvertent computerized transcription errors may still be present.

## 2024-05-15 NOTE — Discharge Instructions (Signed)
 Information on my medicine - ELIQUIS (apixaban)  This medication education was reviewed with me or my healthcare representative as part of my discharge preparation.    Why was Eliquis prescribed for you? Eliquis was prescribed to treat blood clots that may have been found in the veins of your legs (deep vein thrombosis) or in your lungs (pulmonary embolism) and to reduce the risk of them occurring again.  What do You need to know about Eliquis ? The starting dose is 10 mg (two 5 mg tablets) taken TWICE daily for the FIRST SEVEN (7) DAYS, then on (enter date)  05/23/24  the dose is reduced to ONE 5 mg tablet taken TWICE daily.  Eliquis may be taken with or without food.   Try to take the dose about the same time in the morning and in the evening. If you have difficulty swallowing the tablet whole please discuss with your pharmacist how to take the medication safely.  Take Eliquis exactly as prescribed and DO NOT stop taking Eliquis without talking to the doctor who prescribed the medication.  Stopping may increase your risk of developing a new blood clot.  Refill your prescription before you run out.  After discharge, you should have regular check-up appointments with your healthcare provider that is prescribing your Eliquis.    What do you do if you miss a dose? If a dose of ELIQUIS is not taken at the scheduled time, take it as soon as possible on the same day and twice-daily administration should be resumed. The dose should not be doubled to make up for a missed dose.  Important Safety Information A possible side effect of Eliquis is bleeding. You should call your healthcare provider right away if you experience any of the following: Bleeding from an injury or your nose that does not stop. Unusual colored urine (red or dark brown) or unusual colored stools (red or black). Unusual bruising for unknown reasons. A serious fall or if you hit your head (even if there is no  bleeding).  Some medicines may interact with Eliquis and might increase your risk of bleeding or clotting while on Eliquis. To help avoid this, consult your healthcare provider or pharmacist prior to using any new prescription or non-prescription medications, including herbals, vitamins, non-steroidal anti-inflammatory drugs (NSAIDs) and supplements.  This website has more information on Eliquis (apixaban): http://www.eliquis.com/eliquis/home

## 2024-05-16 LAB — CEA: CEA: 1.6 ng/mL (ref 0.0–4.7)

## 2024-05-16 LAB — CANCER ANTIGEN 19-9: CA 19-9: 14 U/mL (ref 0–35)

## 2024-05-16 LAB — AFP TUMOR MARKER: AFP, Serum, Tumor Marker: <1.8 ng/mL (ref 0.0–6.9)

## 2024-05-17 ENCOUNTER — Telehealth: Payer: Self-pay | Admitting: Oncology

## 2024-05-17 LAB — KAPPA/LAMBDA LIGHT CHAINS
Kappa free light chain: 23.9 mg/L — ABNORMAL HIGH (ref 3.3–19.4)
Kappa, lambda light chain ratio: 1.48 (ref 0.26–1.65)
Lambda free light chains: 16.1 mg/L (ref 5.7–26.3)

## 2024-05-19 ENCOUNTER — Telehealth: Payer: Self-pay

## 2024-05-19 LAB — MULTIPLE MYELOMA PANEL, SERUM
Albumin SerPl Elph-Mcnc: 2.9 g/dL (ref 2.9–4.4)
Albumin/Glob SerPl: 1.1 (ref 0.7–1.7)
Alpha 1: 0.3 g/dL (ref 0.0–0.4)
Alpha2 Glob SerPl Elph-Mcnc: 0.7 g/dL (ref 0.4–1.0)
B-Globulin SerPl Elph-Mcnc: 0.8 g/dL (ref 0.7–1.3)
Gamma Glob SerPl Elph-Mcnc: 1.1 g/dL (ref 0.4–1.8)
Globulin, Total: 2.9 g/dL (ref 2.2–3.9)
IgA: 235 mg/dL (ref 90–386)
IgG (Immunoglobin G), Serum: 1111 mg/dL (ref 603–1613)
IgM (Immunoglobulin M), Srm: 97 mg/dL (ref 20–172)
Total Protein ELP: 5.8 g/dL — ABNORMAL LOW (ref 6.0–8.5)

## 2024-05-19 NOTE — Telephone Encounter (Signed)
 Spoke with patient's father, who agreed to pass along message that pt has appt tomorrow morning at [facility] with Dr. Randye Buttner at 0930. Father explained that his son was living currently at a halfway house and that it would be better for the message to be passed along.He did mention that his son is aware of the appt.

## 2024-05-20 ENCOUNTER — Inpatient Hospital Stay: Payer: MEDICAID | Admitting: Oncology

## 2024-05-20 ENCOUNTER — Inpatient Hospital Stay: Payer: MEDICAID

## 2024-05-26 ENCOUNTER — Inpatient Hospital Stay: Payer: Self-pay

## 2024-05-26 ENCOUNTER — Inpatient Hospital Stay: Payer: Self-pay | Attending: Oncology | Admitting: Oncology

## 2024-05-26 VITALS — BP 113/71 | HR 92 | Temp 98.2°F | Resp 18 | Ht 75.0 in | Wt 211.6 lb

## 2024-05-26 DIAGNOSIS — C7A8 Other malignant neuroendocrine tumors: Secondary | ICD-10-CM

## 2024-05-26 DIAGNOSIS — Z7901 Long term (current) use of anticoagulants: Secondary | ICD-10-CM

## 2024-05-26 DIAGNOSIS — I82412 Acute embolism and thrombosis of left femoral vein: Secondary | ICD-10-CM

## 2024-05-26 LAB — SURGICAL PATHOLOGY

## 2024-05-26 MED ORDER — APIXABAN 5 MG PO TABS: 5.0000 mg | ORAL_TABLET | Freq: Two times a day (BID) | ORAL | 1 refills | Status: DC

## 2024-05-26 NOTE — Progress Notes (Signed)
 "  [facility]  ONCOLOGY CONSULT NOTE   PATIENT NAME: KALIM KISSEL   MR#: [id number] DOB: 1971/03/26  DATE OF SERVICE: 05/26/2024   REFERRING PROVIDER  Hospital follow-up.  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  ASSESSMENT & PLAN:   MOHAMED PORTLOCK is a 53 y.o. 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  He did not need adjuvant treatment at that time.  He has been incarcerated intermittently and has not had regular follow-ups.    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 Biopsy indicates intestinal origin. The tumor is extensive and encases the spine, precluding surgical removal.  He reports no major symptoms such as diarrhea, palpitations, or wheezing.   We will proceed with staging PET dotatate scan to determine the disease's full extent.   A multidisciplinary conference will be held to establish the optimal treatment strategy. Radiation therapy and monthly octreotide/lanreotide injections are potential treatment options.  Discussed disease biology, prognosis, plan of care, treatment options.  Reviewed NCCN guidelines.  I will plan to see him with PET dotatate scan results and also following discussion in GI tumor conference.  Acute deep vein thrombosis (DVT) of femoral vein of left lower extremity (HCC) DVT in the leg with improving swelling. He is currently on  Eliquis , with dosage reduced to one tablet twice daily. Anticoagulation will continue for at least six months to prevent further thrombotic events.   - Send prescription for Eliquis  with refills to his pharmacy   - Continue Eliquis  for at least six months   I reviewed lab results and outside records for this visit and discussed relevant results with the patient. Diagnosis, plan of care and treatment options were also discussed in detail with the patient. Opportunity provided to ask questions and answers provided to his apparent satisfaction. Provided instructions to call our clinic with any problems, questions or concerns prior to return visit. I recommended to continue follow-up with PCP and sub-specialists. He verbalized understanding and agreed with the plan. No barriers to learning was detected.  NCCN guidelines have been consulted in the planning of this patients care.  Shady Bradish, MD   [facility] - A DEPT OF [facility] [address] [place] [address] Dept: [phone] Dept Fax: [phone]   HISTORY OF PRESENTING ILLNESS:   I have reviewed his chart and materials related to his cancer extensively and collaborated history with the patient. Summary of oncologic history is as follows:  ONCOLOGY HISTORY:  53 y.o. 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  He was a prisoner at that time and hence records were not available to review.  He presented to the ED on 05/13/2024 with complaints of left leg that was worsening over the last 3 weeks prior to presentation.  Patient initially reported to the ER on 5/13 for left leg swelling worsened over the last 3 weeks  but appears he left before being seen.  Patient then came back to the ER on 5/14 where he had an ultrasound of his leg that was negative for DVTs.  He also had CT angio aorta plus  BiFem that showed a tumor in his spinal canal.    On 05/12/2024, pelvic/lumbar MRI showed extensive tumor occupying the lower spinal canal.  The spinal canal was markedly widened and tumor extends out through the sacral foramen.  Osseous lesions in the T12 and L5 vertebra concerning for osseous metastatic disease. Patient had no associated lymphadenopathy.    CT scan showed left common femoral vein and iliac vein as well as left femoral vein were thrombosed.  He then again left AGAINST MEDICAL ADVICE due to needing to report back to his halfway house.  He was given a dose of Lovenox .  Patient came back to the ER on 5/15 where the ER reached out to both neurosurgery and oncology.  Neurosurgery recommended a CT chest abdomen pelvis for metastatic workup and IR guided biopsy for further treatment.   On 05/13/2024, CT chest, abdomen and pelvis showed at least 2 ill-defined hypoattenuating masses in the right hepatic lobe, segment 5, incompletely characterized.  No metastatic disease identified within the chest.  On 05/14/2024, he underwent CT-guided biopsy of the sacral soft tissue mass.  Pathology showed metastatic well-differentiated neuroendocrine tumor, consistent with a gastrointestinal primary. Immunohistochemical stains show that the tumor cells are positive for synaptophysin chromogranin and CDX2 with dot-like cytoplasmic labeling  on cytokeratin AE1/AE3.  Immunostains for CD138, MUM 1, TTF-1, kappa and lambda light chains are negative. Immunostain for Ki-67 shows a low proliferative index (1-2%), consistent with a low-grade tumor.   He presented to our clinic on 05/26/2024 to establish care.  Request placed for PET dotatate scan.  Given the extent of disease, he may not be a surgical candidate.  Hence we will discuss his case in multidisciplinary conference to see if radiation therapy would be an option.  No carcinoid symptoms currently.  INTERVAL HISTORY:  Discussed the use of AI scribe software for  clinical note transcription with the patient, who gave verbal consent to proceed.  History of Present Illness UNDRAY ALLMAN is a 53 year old male with a history of neuroendocrine tumor who presents with leg swelling due to blood clots. He was referred by Dr. Jonel for follow-up after hospitalization for leg swelling due to blood clots.  He is experiencing leg swelling attributed to blood clots, which is currently decreasing. He is taking Eliquis , though he has not completed the course yet. No leg weakness, falls, or breathing difficulties. No trouble controlling bowel or bladder.  He has a history of a neuroendocrine tumor, surgically removed from his small intestine in 2018 at [facility] while he was in prison. He reports no recurrence of symptoms until recently. No diarrhea, loose stools, or significant pain beyond his usual sciatic nerve and arthritis discomfort. He experiences occasional flushing and palpitations, particularly when his blood pressure rises or when he moves quickly.  He resides in a federal halfway house in [place] after being released from prison. He prefers to have his prescriptions sent to a [facility] near his residence for convenience.   MEDICAL HISTORY:  Past Medical History:  Diagnosis Date   Cancer (HCC)    Hyperlipemia    Hypertension     SURGICAL HISTORY: Past Surgical History:  Procedure Laterality Date   CHOLECYSTECTOMY     HERNIA REPAIR  SOCIAL HISTORY: Social History   Socioeconomic History   Marital status: Divorced    Spouse name: Not on file   Number of children: Not on file   Years of education: Not on file   Highest education level: Not on file  Occupational History   Not on file  Tobacco Use   Smoking status: Every Day   Smokeless tobacco: Not on file  Substance and Sexual Activity   Alcohol  use: No   Drug use: No   Sexual activity: Not on file  Other Topics Concern   Not on file  Social History Narrative   Not on  file   Social Drivers of Health   Financial Resource Strain: Not on file  Food Insecurity: Food Insecurity Present (05/13/2024)   Hunger Vital Sign    Worried About Running Out of Food in the Last Year: Often true    Ran Out of Food in the Last Year: Often true  Transportation Needs: Unmet Transportation Needs (05/13/2024)   PRAPARE - Administrator, Civil Service (Medical): Yes    Lack of Transportation (Non-Medical): Yes  Physical Activity: Not on file  Stress: Not on file  Social Connections: Not on file  Intimate Partner Violence: Not At Risk (05/13/2024)   Humiliation, Afraid, Rape, and Kick questionnaire    Fear of Current or Ex-Partner: No    Emotionally Abused: No    Physically Abused: No    Sexually Abused: No    FAMILY HISTORY: No family history on file.  ALLERGIES:  He is allergic to bee venom.  MEDICATIONS:  Current Outpatient Medications  Medication Sig Dispense Refill   apixaban  (ELIQUIS ) 5 MG TABS tablet Take 1 tablet (5 mg total) by mouth 2 (two) times daily. 180 tablet 1   APIXABAN  (ELIQUIS ) VTE STARTER PACK (10MG  AND 5MG ) Take as directed on package: start with two-5mg  tablets twice daily for 7 days. On day 8, switch to one-5mg  tablet twice daily. 74 each 0   Buprenorphine  HCl-Naloxone  HCl 8-2 MG FILM Place 1 Film under the tongue in the morning, at noon, and at bedtime.     losartan (COZAAR) 25 MG tablet Take 25 mg by mouth daily.     oxyCODONE  (OXY IR/ROXICODONE ) 5 MG immediate release tablet Take 1 tablet (5 mg total) by mouth every 6 (six) hours as needed for breakthrough pain. (Patient not taking: Reported on 05/26/2024) 15 tablet 0   No current facility-administered medications for this visit.    REVIEW OF SYSTEMS:    Review of Systems - Oncology  All other pertinent systems were reviewed with the patient and are negative.  PHYSICAL EXAMINATION:    Onc Performance Status - 05/28/24 1600       ECOG Perf Status   ECOG Perf Status  Fully active, able to carry on all pre-disease performance without restriction      KPS SCALE   KPS % SCORE Normal, no compliants, no evidence of disease             Vitals:   05/26/24 1544  BP: 113/71  Pulse: 92  Resp: 18  Temp: 98.2 F (36.8 C)  SpO2: 98%   Filed Weights   05/26/24 1544  Weight: 211 lb 9.6 oz (96 kg)    Physical Exam Constitutional:      General: He is not in acute distress.    Appearance: Normal appearance.  HENT:     Head: Normocephalic and atraumatic.  Eyes:  Conjunctiva/sclera: Conjunctivae normal.  Cardiovascular:     Rate and Rhythm: Normal rate and regular rhythm.     Heart sounds: Normal heart sounds.  Pulmonary:     Effort: Pulmonary effort is normal. No respiratory distress.     Breath sounds: Normal breath sounds.  Abdominal:     General: There is no distension.  Neurological:     General: No focal deficit present.     Mental Status: He is alert and oriented to person, place, and time.  Psychiatric:        Mood and Affect: Mood normal.        Behavior: Behavior normal.       LABORATORY DATA:   I have reviewed the data as listed.  No results found for any visits on 05/26/24.  Lab Results  Component Value Date   WBC 4.2 05/15/2024   HGB 9.6 (L) 05/15/2024   HCT 30.8 (L) 05/15/2024   MCV 92.5 05/15/2024   PLT 102 (L) 05/15/2024   Recent Labs    05/13/24 0945 05/14/24 0117 05/15/24 0520  NA 138 138 141  K 3.6 3.4* 4.4  CL 106 107 107  CO2 25 27 27   GLUCOSE 95 100* 93  BUN 9 10 10   CREATININE 0.68 0.72 0.84  CALCIUM 8.9 8.3* 8.5*  GFRNONAA >60 >60 >60  PROT  --  6.2*  --   ALBUMIN  --  3.1*  --   AST  --  13*  --   ALT  --  14  --   ALKPHOS  --  64  --   BILITOT  --  0.7  --     RADIOGRAPHIC STUDIES:  I have personally reviewed the radiological images as listed and agree with the findings in the report.  CT BIOPSY Result Date: 05/15/2024 CLINICAL DATA:  Lytic soft tissue mass of the sacrum, no  known primary. EXAM: CT GUIDED CORE BIOPSY OF SACRUM ANESTHESIA/SEDATION: Intravenous Fentanyl  100mcg and Versed  2mg  were administered by RN during a total moderate (conscious) sedation time of 15 minutes; the patient's level of consciousness and physiological / cardiorespiratory status were monitored continuously by radiology RN under my direct supervision. PROCEDURE: The procedure risks, benefits, and alternatives were explained to the patient. Questions regarding the procedure were encouraged and answered. The patient understands and consents to the procedure. patient placed prone. select axial scans through the sacrum and pelvis were obtained. the lesion was localized and an appropriate skin entry site was determined and marked. The operative field was prepped with chlorhexidinein a sterile fashion, and a sterile drape was applied covering the operative field. A sterile gown and sterile gloves were used for the procedure. Local anesthesia was provided with 1% Lidocaine . Under CT fluoroscopic guidance, an 17 gauge trocar needle was advanced to the margin of the lesion taking shallow approach to avoid the spinal canal and spinal nerves. Coaxial 18 gauge core biopsy samples were obtained, submitted in saline to surgical pathology. Postprocedure scans show no hemorrhage or other apparent complication. The patient tolerated the procedure well. COMPLICATIONS: None immediate FINDINGS: Lytic soft tissue lesion involving the sacrum and sacral foramina was localized. Representative core biopsy samples obtained as above. IMPRESSION: 1. Technically successful CT-guided core biopsy, sacral lesion. RADIATION DOSE REDUCTION: This exam was performed according to the departmental dose-optimization program which includes automated exposure control, adjustment of the mA and/or kV according to patient size and/or use of iterative reconstruction technique. Electronically Signed   By: JONETTA Johann HERO.D.  On: 05/15/2024 16:54   CT  CHEST ABDOMEN PELVIS W CONTRAST Result Date: 05/13/2024 CLINICAL DATA:  Metastatic disease evaluation. History of sacral mass. * Tracking Code: BO * EXAM: CT CHEST, ABDOMEN, AND PELVIS WITH CONTRAST TECHNIQUE: Multidetector CT imaging of the chest, abdomen and pelvis was performed following the standard protocol during bolus administration of intravenous contrast. RADIATION DOSE REDUCTION: This exam was performed according to the departmental dose-optimization program which includes automated exposure control, adjustment of the mA and/or kV according to patient size and/or use of iterative reconstruction technique. CONTRAST:  OMNIPAQUE  IOHEXOL  300 MG/ML  SOLN COMPARISON:  MRI lumbar spine, MRI pelvis and CT angio runoff from 05/12/2024. FINDINGS: CT CHEST FINDINGS Cardiovascular: Normal cardiac size. No pericardial effusion. No aortic aneurysm. Mediastinum/Nodes: Visualized thyroid  gland appears grossly unremarkable. No solid / cystic mediastinal masses. The esophagus is nondistended precluding optimal assessment. There are few mildly prominent mediastinal lymph nodes, which do not meet the size criteria for lymphadenopathy and though indeterminate most likely benign in etiology. No axillary or hilar lymphadenopathy by size criteria. Lungs/Pleura: The central tracheo-bronchial tree is patent. No mass or consolidation. No pleural effusion or pneumothorax. No suspicious lung nodules. Musculoskeletal: The visualized soft tissues of the chest wall are grossly unremarkable. No suspicious osseous lesions. There are mild multilevel degenerative changes in the visualized spine. CT ABDOMEN PELVIS FINDINGS Hepatobiliary: The liver is normal in size. Non-cirrhotic configuration. There are at least 2, ill-defined hypoattenuating masses in the right hepatic lobe, segment 5, which are incompletely characterized on the current exam but suspicious. Further evaluation fifth multiphasic contrast-enhanced MRI abdomen as per  liver mass protocol is recommended. No intrahepatic or extrahepatic bile duct dilation. Gallbladder is surgically absent. Pancreas: Unremarkable. No pancreatic ductal dilatation or surrounding inflammatory changes. Spleen: Within normal limits. No focal lesion. Adrenals/Urinary Tract: Adrenal glands are unremarkable. No suspicious renal mass. No hydronephrosis. No renal or ureteric calculi. Urinary bladder is under distended, precluding optimal assessment. However, no large mass or stones identified. No perivesical fat stranding. Stomach/Bowel: No disproportionate dilation of the small or large bowel loops. No evidence of abnormal bowel wall thickening or inflammatory changes. The appendix is unremarkable. Bowel anastomotic suture noted in the right lower quadrant. Vascular/Lymphatic: No ascites or pneumoperitoneum. No abdominal or pelvic lymphadenopathy, by size criteria. No aneurysmal dilation of the major abdominal arteries. Reproductive: Normal size prostate. Symmetric seminal vesicles. Other: Periumbilical midline surgical scar noted. There is mild anasarca. There are soft tissue density areas in the anterior abdominal wall subcutaneous tissue, most likely due to medication injections. Musculoskeletal: There is focal expansion of the upper sacral spinal canal with remodeling of the upper sacrum. There are sclerotic areas within the upper sacrum. 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 Please refer to MRI lumbar spine report from 05/12/2024 for details. IMPRESSION: 1. There are at least 2, ill-defined hypoattenuating masses in the right hepatic lobe, segment 5, which are incompletely characterized on the current exam but suspicious. Further evaluation fifth multiphasic contrast-enhanced MRI abdomen as per liver mass protocol is recommended. 2. No metastatic disease identified within the chest. 3. Redemonstration of patient's known upper sacral mass. Please refer  to MRI lumbar spine and MRI pelvis report of from yesterday for details. 4. Multiple other nonacute observations, as described above. Electronically Signed   By: Ree Molt M.D.   On: 05/13/2024 15:13   MR PELVIS W WO CONTRAST Result Date: 05/12/2024 CLINICAL DATA:  Sacral mass seen on recent  CT scan and lumbar spine MRI. EXAM: MRI PELVIS WITHOUT AND WITH CONTRAST TECHNIQUE: Multiplanar multisequence MR imaging of the pelvis was performed both before and after administration of intravenous contrast. CONTRAST:  10mL GADAVIST  GADOBUTROL  1 MMOL/ML IV SOLN COMPARISON:  CT scan 05/12/2024 and MRI lumbar spine 05/12/2024 FINDINGS: Urinary Tract: The bladder is unremarkable. No bladder mass or calculi. Bowel: Moderate pericolonic inflammatory changes but no rectal or sigmoid colon mass is identified. The visualized pelvic small bowel loops are unremarkable. Vascular/Lymphatic: The left common femoral vein, external iliac vein and left femoral vein are thrombosed. Associated surrounding inflammatory changes. This likely accounts for the patient's massive subcutaneous edema involving the left hip and thigh region. No overt lymphadenopathy the. Reproductive:  The prostate gland seminal vesicles are unremarkable. Other: Extensive tumor occupying the spinal canal as noted on the recent MRI lumbar spine. The tumor begins at the L5-S1 disc space and extends down to the bottom of S3. The spinal canal is markedly widened and tumor extends out through the sacral neural foramen. There is also involvement of all of the bony sacral segments with tumor and lumbar spine lesions are also noted. However, there is no destructive bony changes or obvious cortical erosion. There is also enhancing presacral tumor at the same levels. No associated lymphadenopathy but lymphoma is certainly a possibility. Musculoskeletal: Infiltrating lumbar and sacral lesions without destructive cortical changes possibly reflecting a process such as  lymphoma. IMPRESSION: 1. 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 The spinal canal is markedly widened and tumor extends out through the sacral neural foramen. There is also involvement of all of the bony sacral segments with tumor and lumbar spine lesions are also noted. However, there are no destructive bony changes or obvious cortical erosion. There is also enhancing presacral tumor at the same levels. No associated lymphadenopathy but lymphoma is certainly a possibility. 2. The left common femoral vein, external iliac vein and left femoral vein are thrombosed. Associated surrounding inflammatory changes. This likely accounts for the patient's massive subcutaneous edema involving the left hip and thigh region. Electronically Signed   By: MYRTIS Stammer M.D.   On: 05/12/2024 21:36   MR Lumbar Spine W Wo Contrast Result Date: 05/12/2024 CLINICAL DATA:  Metastatic disease evaluation, lower back pain radiating into the left glute muscles. Left leg swelling. EXAM: MRI LUMBAR SPINE WITHOUT AND WITH CONTRAST TECHNIQUE: Multiplanar and multiecho pulse sequences of the lumbar spine were obtained without and with intravenous contrast. CONTRAST:  10mL GADAVIST  GADOBUTROL  1 MMOL/ML IV SOLN COMPARISON:  None Available. FINDINGS: Segmentation:  Standard. Alignment: Lumbar lordosis is maintained. Grade 1 anterolisthesis of L5 on S1. Vertebrae: There is a T1 hypointense, stir hyperintense lesion in the right posterior aspect of T12 which demonstrates mild enhancement. Lesion partially extends into the right pedicle of T12. Additional similar appearing lesion within the right anterior inferior aspect of the L5 vertebra. Irregularity of the L4 superior endplate with associated mild edema and enhancement likely reflecting Schmorl's nodes. Additional Modic type 1 degenerative endplate changes anteriorly at L3-4 with mild associated discogenic edema. Additional remote Schmorl's nodes in the lower thoracic and upper lumbar  spine. Vertebral body heights otherwise maintained. There is a mildly enhancing STIR hyperintense mass along the dorsal aspect of the sacrum which measures 8.1 x 2.6 cm in maximum dimensions on sagittal images. There is additional signal abnormality within the visualized S1-S3 sacral vertebra with associated enhancement. Conus medullaris and cauda equina: Conus extends to the L1-2 level. Conus and cauda equina  appear normal. Paraspinal and other soft tissues: There is presacral edema and enhancement which is partially visualized. The paraspinal musculature is otherwise unremarkable. Disc levels: T12-L1: Minimal disc bulge. Facet arthrosis indents the dorsal thecal sac. No significant spinal canal stenosis. No significant foraminal stenosis. L1-2: Mild facet arthrosis indents the dorsal thecal sac. No significant spinal canal stenosis. No significant foraminal stenosis. L2-3: Minimal disc bulge which indents the ventral thecal sac with lateral recess narrowing without impingement upon the traversing nerve roots. Moderate facet arthrosis indents the dorsal thecal sac. Mild spinal canal stenosis. There is mild foraminal stenosis on the right. L3-4: Disc desiccation and moderate disc height loss. Diffuse disc bulge eccentric to the right which indents the ventral thecal sac with lateral recess narrowing more pronounced on the right. There is likely impingement upon the traversing right L4 nerve roots. Additional right paracentral component of disc extrusion with approximately 4 mm cranial migration of disc contents. Mild facet arthrosis indents the dorsal thecal sac. Mild spinal canal stenosis. There is moderate bilateral foraminal stenosis. Disc bulge likely contacts the exiting bilateral L3 nerve roots. L4-5: Small disc bulge resulting in lateral recess narrowing without significant impingement upon the traversing nerve roots. Moderate to severe facet arthrosis which indents the dorsal thecal sac. Mild spinal canal  stenosis. There is mild foraminal stenosis on the left. L5-S1: Grade 1 anterolisthesis with partial uncovering of the disc. Diffuse disc bulge indents the ventral thecal sac with lateral recess narrowing without significant impingement upon the traversing nerve roots. Severe facet arthrosis indents the dorsal thecal sac. There is thickening of the ligamentum flavum which appears edematous with associated enhancement. Mild-to-moderate spinal canal stenosis. There is moderate bilateral foraminal stenosis. Disc bulge abuts the bilateral L5 nerve roots. Mass along the dorsal aspect of the sacrum abuts the ventral and inferior aspect of the thecal sac just below the level of the L5-S1 disc without evidence of extension into the thecal sac. The mass extends into the bilateral S1 sacral foramina in appears to displace the sacral nerve roots superiorly within the foramina. The tumor appears to envelop the nerve roots. Enhancement along the visualized sacral nerve roots concerning for perineural spread. 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 Large soft tissue mass along the dorsal aspect of the sacrum extending from the superior margin of S1 to S3. Associated bone marrow signal abnormality and enhancement of the visualized S1 through S3 vertebra concerning for additional sites of osseous metastatic disease. Soft tissue mass extends into the visualized S1 sacral foramina and appears to envelop the bilateral S1 nerve roots. Partially visualized presacral edema and enhancement. Degenerative changes of the lumbar spine as above. No high-grade spinal canal stenosis. Lateral recess narrowing at multiple levels. Disc bulge at L3-4 likely results in impingement of the traversing right L4 nerve roots. Multilevel foraminal stenosis, greatest and moderate bilaterally at L3-4 and L5-S1. Disc bulges at L3-4 and L5-S1 likely abut the exiting L3 and L5 nerve roots respectively.  Electronically Signed   By: Donnice Mania M.D.   On: 05/12/2024 20:25   CT ANGIO AO+BIFEM W & OR WO CONTRAST Result Date: 05/12/2024 CLINICAL DATA:  Left leg swelling, ?phlegmasia EXAM: CT ANGIOGRAPHY CHEST WITH CONTRAST TECHNIQUE: Multidetector CT imaging of the abdomen and pelvis with run-off to both lower extremities was performed using the standard protocol during bolus administration of intravenous contrast. Multiplanar CT image reconstructions and MIPs were obtained to evaluate the vascular anatomy. RADIATION DOSE REDUCTION: This  exam was performed according to the departmental dose-optimization program which includes automated exposure control, adjustment of the mA and/or kV according to patient size and/or use of iterative reconstruction technique. CONTRAST:  OMNIPAQUE  IOHEXOL  350 MG/ML SOLN COMPARISON:  None Available. FINDINGS: VASCULAR Aorta: No aneurysm or aortic dissection. No acute thrombus. Celiac: Patent without aneurysm, dissection, or hemodynamically significant stenosis. SMA: Patent without aneurysm, dissection, or hemodynamically significant stenosis. Renals: Patent without aneurysm, dissection, or hemodynamically significant stenosis. IMA: Patent without aneurysm, dissection, or hemodynamically significant stenosis. RIGHT Lower Extremity Inflow: Common, internal and external iliac arteries are patent without aneurysm, acute thrombus, or dissection. No plaque or hemodynamically significant stenosis. Outflow: Common, superficial and profunda femoral, and the popliteal arteries are patent without acute thrombus, aneurysm, or dissection. No plaque or hemodynamically significant stenosis. Runoff: Patent three vessel runoff to the foot. LEFT Lower Extremity Inflow: Common, internal and external iliac arteries are patent without aneurysm, acute thrombus, or dissection. No plaque or hemodynamically significant stenosis. Outflow: Common, superficial and profunda femoral, and the popliteal  arteries are patent without acute thrombus, aneurysm, or dissection. No plaque or hemodynamically significant stenosis. Runoff: Patent three vessel runoff to the foot. Veins: Not evaluated due to the phase of contrast. Review of the MIP images confirms the above findings. NON-VASCULAR Lower chest: No focal airspace consolidation or pleural effusion. Hepatobiliary: No mass.Cholecystectomy.No intrahepatic or extrahepatic biliary ductal dilation. Pancreas: No mass or main ductal dilation.No peripancreatic inflammation or fluid collection. Spleen: Normal size. No mass. Adrenals/Urinary Tract: No adrenal masses. No renal mass. No hydronephrosis or nephrolithiasis. Circumferential wall thickening of the urinary bladder. Stomach/Bowel: The stomach is decompressed without focal abnormality. No small bowel wall thickening or inflammation. No small bowel obstruction. The appendix was not visualized. No right lower quadrant or pericecal inflammatory changes to suggest acute appendicitis. Mucosal hyperenhancement and mild wall thickening in the rectosigmoid colon. Presacral stranding also present. Vascular/Lymphatic: Mildly enlarged left external iliac chain lymph node measuring 1.3 cm (axial 219). Reproductive: No prostatomegaly.no free pelvic fluid. Other: No pneumoperitoneum, ascites, or mesenteric inflammation. Musculoskeletal: No acute fracture. Soft tissue mass within the spinal canal causing bony expansion and overlying destruction of the sacrum, centered at S2, measuring approximately 3.1 x 5.3 x 5.2 cm (axial 196). This causes significant narrowing of the S2 neural foramina.Moderate diffuse subcutaneous edema throughout the left leg with diffuse skin thickening present. Multilevel degenerative disc disease of the spine. IMPRESSION: VASCULAR 1. No aortic aneurysm or aortic dissection. 2. No acute thrombus or hemodynamically significant stenosis within the inflow or outflow vessels to either leg. Patent three-vessel  runoff to the feet bilaterally. NON-VASCULAR 1. Mucosal hyperenhancement and mild wall thickening of the rectosigmoid colon with presacral stranding, which may reflect changes of an infectious or inflammatory proctocolitis. 2. Soft tissue mass within the spinal canal causing bony expansion and overlying destruction of the sacral bone, centered at S2, measuring approximately 3.1 x 5.3 x 5.2 cm. This is nonspecific, worrisome for underlying neoplasm. A follow-up pelvic MRI with IV contrast is recommended for further characterization. Electronically Signed   By: Rogelia Myers M.D.   On: 05/12/2024 13:53   VAS US  LOWER EXTREMITY VENOUS (DVT) (7a-7p) Result Date: 05/12/2024  Lower Venous DVT Study Patient Name:  AGUSTIN SWATEK  Date of Exam:   05/12/2024 Medical Rec #: [id number]         Accession #:    [id number] Date of Birth: 1971-01-09          Patient Gender: M Patient Age:  52 years Exam Location:  [facility] Procedure:      VAS US  LOWER EXTREMITY VENOUS (DVT) Referring Phys: JAYSON PEREYRA --------------------------------------------------------------------------------  Indications: Swelling.  Risk Factors: None identified. Limitations: Poor ultrasound/tissue interface. Comparison Study: No prior studies. Performing Technologist: Cordella Collet RVT  Examination Guidelines: A complete evaluation includes B-mode imaging, spectral Doppler, color Doppler, and power Doppler as needed of all accessible portions of each vessel. Bilateral testing is considered an integral part of a complete examination. Limited examinations for reoccurring indications may be performed as noted. The reflux portion of the exam is performed with the patient in reverse Trendelenburg.  +-----+---------------+---------+-----------+----------+--------------+ RIGHTCompressibilityPhasicitySpontaneityPropertiesThrombus Aging +-----+---------------+---------+-----------+----------+--------------+ CFV  Full           Yes       Yes                                 +-----+---------------+---------+-----------+----------+--------------+   +---------+---------------+---------+-----------+----------+-------------------+ LEFT     CompressibilityPhasicitySpontaneityPropertiesThrombus Aging      +---------+---------------+---------+-----------+----------+-------------------+ CFV      Full           Yes      Yes                                      +---------+---------------+---------+-----------+----------+-------------------+ SFJ      Full                                                             +---------+---------------+---------+-----------+----------+-------------------+ FV Prox  Full                                                             +---------+---------------+---------+-----------+----------+-------------------+ FV Mid                  Yes      Yes                                      +---------+---------------+---------+-----------+----------+-------------------+ FV Distal               Yes      Yes                                      +---------+---------------+---------+-----------+----------+-------------------+ PFV      Full                                                             +---------+---------------+---------+-----------+----------+-------------------+ POP      Full           Yes      Yes                                      +---------+---------------+---------+-----------+----------+-------------------+  PTV      Full                                                             +---------+---------------+---------+-----------+----------+-------------------+ PERO                                                  Not well visualized +---------+---------------+---------+-----------+----------+-------------------+    Summary: RIGHT: - No evidence of common femoral vein obstruction.   LEFT: - There is no evidence of deep vein thrombosis in the  lower extremity. However, portions of this examination were limited- see technologist comments above.  - No cystic structure found in the popliteal fossa.  *See table(s) above for measurements and observations. Electronically signed by Lonni Gaskins MD on 05/12/2024 at 10:25:20 AM.    Final     Orders Placed This Encounter  Procedures   NM PET DOTATATE SKULL BASE TO MID THIGH    Standing Status:   Future    Expected Date:   06/02/2024    Expiration Date:   05/26/2025    If indicated for the ordered procedure, I authorize the administration of a radiopharmaceutical per Radiology protocol:   Yes    Preferred imaging location?:   [facility]   CBC with Differential (Cancer Center Only)    Standing Status:   Future    Expected Date:   06/09/2024    Expiration Date:   05/28/2025   CMP (Cancer Center only)    Standing Status:   Future    Expected Date:   06/09/2024    Expiration Date:   05/28/2025   Lactate dehydrogenase    Standing Status:   Future    Expected Date:   06/09/2024    Expiration Date:   05/28/2025   Chromogranin A    Standing Status:   Future    Expected Date:   06/09/2024    Expiration Date:   05/28/2025    CODE STATUS:  Code Status History     Date Active Date Inactive Code Status Order ID Comments User Context   05/13/2024 1147 05/15/2024 2350 Full Code [id number]  Juvenal Harlene PENNER, DO ED    Questions for Most Recent Historical Code Status (Order [id number])     Question Answer   By: Consent: discussion documented in EHR            Future Appointments  Date Time Provider Department Center  06/02/2024  4:30 PM [facility] PET CT 1 [facility] [place]  06/09/2024  2:15 PM [facility] None  06/09/2024  2:45 PM Nhyla Nappi, MD [facility] None     I spent a total of 60 minutes during this encounter with the patient including review of chart and various tests results, discussions about plan of care and coordination of care plan.  This document was completed  utilizing speech recognition software. Grammatical errors, random word insertions, pronoun errors, and incomplete sentences are an occasional consequence of this system due to software limitations, ambient noise, and hardware issues. Any formal questions or concerns about the content, text or information contained within the body of this dictation should be directly addressed to the  provider for clarification.  "

## 2024-05-28 ENCOUNTER — Encounter: Payer: Self-pay | Admitting: Oncology

## 2024-05-28 ENCOUNTER — Telehealth: Payer: Self-pay

## 2024-05-28 DIAGNOSIS — I82412 Acute embolism and thrombosis of left femoral vein: Secondary | ICD-10-CM | POA: Insufficient documentation

## 2024-05-28 DIAGNOSIS — C7A8 Other malignant neuroendocrine tumors: Secondary | ICD-10-CM | POA: Insufficient documentation

## 2024-05-28 NOTE — Assessment & Plan Note (Addendum)
 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  He did not need adjuvant treatment at that time.  He has been incarcerated intermittently and has not had regular follow-ups.    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 Biopsy indicates intestinal origin. The tumor is extensive and encases the spine, precluding surgical removal.  He reports no major symptoms such as diarrhea, palpitations, or wheezing.   We will proceed with staging PET dotatate scan to determine the disease's full extent.   A multidisciplinary conference will be held to establish the optimal treatment strategy. Radiation therapy and monthly octreotide/lanreotide injections are potential treatment options.  Discussed disease biology, prognosis, plan of care, treatment options.  Reviewed NCCN guidelines.  I will plan to see him with PET dotatate scan results and also following discussion in GI tumor conference.

## 2024-05-28 NOTE — Assessment & Plan Note (Signed)
 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 He is currently on Eliquis , with dosage reduced to one tablet twice daily. Anticoagulation will continue for at least six months to prevent further thrombotic events.   - Send prescription for Eliquis  with refills to his pharmacy   - Continue Eliquis  for at least six months

## 2024-05-28 NOTE — Telephone Encounter (Signed)
 Spoke with patient's father who agreed to follow up on patient's Eliquis . Patient lives in half-way-house and a request for a voucher was sent to [facility] for the patient's meds from the half-way-house where pt resides. Have tried to reach "Jimmy Peak" at [phone] and have left x2 vm to return my call and also a fax at [phone] requesting a call as we have questions involving their request from the half-way-house. "Vouchers" for meds is not something our department entails but we would like to, perhaps, satisfy their request, somehow, with maybe a letter for patient's Eliquis . Dr. Randye Buttner has not prescribed anything other than Eliquis . Patient's father is aware of all of this and will attempt to reach the half-way-house staff to see if he can find anything out and/or encourage them to call Dr. Denette Finner office. This Clinical research associate is trying to minimize any delay in care.

## 2024-06-02 ENCOUNTER — Encounter (HOSPITAL_COMMUNITY)
Admission: RE | Admit: 2024-06-02 | Discharge: 2024-06-02 | Disposition: A | Discharge status: Home / Self Care | Payer: Self-pay | Source: Ambulatory Visit | Attending: Oncology | Admitting: Oncology

## 2024-06-02 DIAGNOSIS — C7A8 Other malignant neuroendocrine tumors: Secondary | ICD-10-CM | POA: Insufficient documentation

## 2024-06-02 MED ORDER — COPPER CU 64 DOTATATE 1 MCI/ML IV SOLN: 4.0000 | Freq: Once | INTRAVENOUS | Status: DC

## 2024-06-09 ENCOUNTER — Other Ambulatory Visit (HOSPITAL_COMMUNITY): Payer: Self-pay

## 2024-06-09 ENCOUNTER — Inpatient Hospital Stay (HOSPITAL_BASED_OUTPATIENT_CLINIC_OR_DEPARTMENT_OTHER): Payer: Self-pay | Admitting: Oncology

## 2024-06-09 ENCOUNTER — Other Ambulatory Visit: Payer: Self-pay

## 2024-06-09 ENCOUNTER — Encounter: Payer: Self-pay | Admitting: Oncology

## 2024-06-09 ENCOUNTER — Inpatient Hospital Stay: Payer: Self-pay | Attending: Oncology

## 2024-06-09 VITALS — BP 114/63 | HR 71 | Temp 97.9°F | Resp 16 | Ht 75.0 in | Wt 208.0 lb

## 2024-06-09 DIAGNOSIS — I82412 Acute embolism and thrombosis of left femoral vein: Secondary | ICD-10-CM

## 2024-06-09 DIAGNOSIS — C7B8 Other secondary neuroendocrine tumors: Secondary | ICD-10-CM | POA: Insufficient documentation

## 2024-06-09 DIAGNOSIS — Z7901 Long term (current) use of anticoagulants: Secondary | ICD-10-CM | POA: Insufficient documentation

## 2024-06-09 DIAGNOSIS — C7A8 Other malignant neuroendocrine tumors: Secondary | ICD-10-CM

## 2024-06-09 LAB — CMP (CANCER CENTER ONLY)
ALT: 9 U/L (ref 0–44)
AST: 14 U/L — ABNORMAL LOW (ref 15–41)
Albumin: 4.1 g/dL (ref 3.5–5.0)
Alkaline Phosphatase: 69 U/L (ref 38–126)
Anion gap: 4 — ABNORMAL LOW (ref 5–15)
BUN: 9 mg/dL (ref 6–20)
CO2: 28 mmol/L (ref 22–32)
Calcium: 9.2 mg/dL (ref 8.9–10.3)
Chloride: 109 mmol/L (ref 98–111)
Creatinine: 0.79 mg/dL (ref 0.61–1.24)
GFR, Estimated: >60 mL/min (ref >60)
Glucose, Bld: 88 mg/dL (ref 70–99)
Potassium: 4.1 mmol/L (ref 3.5–5.1)
Sodium: 141 mmol/L (ref 135–145)
Total Bilirubin: 0.5 mg/dL (ref 0.0–1.2)
Total Protein: 7.4 g/dL (ref 6.5–8.1)

## 2024-06-09 LAB — CBC WITH DIFFERENTIAL (CANCER CENTER ONLY)
Abs Immature Granulocytes: 0.01 10*3/uL (ref 0.00–0.07)
Basophils Absolute: 0 10*3/uL (ref 0.0–0.1)
Basophils Relative: 1 %
Eosinophils Absolute: 0.1 10*3/uL (ref 0.0–0.5)
Eosinophils Relative: 3 %
HCT: 35.6 % — ABNORMAL LOW (ref 39.0–52.0)
Hemoglobin: 11.7 g/dL — ABNORMAL LOW (ref 13.0–17.0)
Immature Granulocytes: 0 %
Lymphocytes Relative: 29 %
Lymphs Abs: 1.3 10*3/uL (ref 0.7–4.0)
MCH: 28.3 pg (ref 26.0–34.0)
MCHC: 32.9 g/dL (ref 30.0–36.0)
MCV: 86 fL (ref 80.0–100.0)
Monocytes Absolute: 0.4 10*3/uL (ref 0.1–1.0)
Monocytes Relative: 10 %
Neutro Abs: 2.5 10*3/uL (ref 1.7–7.7)
Neutrophils Relative %: 57 %
Platelet Count: 155 10*3/uL (ref 150–400)
RBC: 4.14 MIL/uL — ABNORMAL LOW (ref 4.22–5.81)
RDW: 13.6 % (ref 11.5–15.5)
WBC Count: 4.4 10*3/uL (ref 4.0–10.5)
nRBC: 0 % (ref 0.0–0.2)

## 2024-06-09 LAB — LACTATE DEHYDROGENASE: LDH: 127 U/L (ref 98–192)

## 2024-06-09 MED ORDER — APIXABAN 5 MG PO TABS
5.0000 mg | ORAL_TABLET | Freq: Two times a day (BID) | ORAL | 1 refills | Status: DC
  Filled 2024-06-09: qty 60, 30d supply, fill #0

## 2024-06-09 NOTE — Assessment & Plan Note (Signed)
 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 He is currently on Eliquis , with dosage reduced to one tablet twice daily. Anticoagulation will continue for at least six months to prevent further thrombotic events.   - Send prescription for Eliquis  with refills to his pharmacy   - Continue Eliquis  for at least six months

## 2024-06-09 NOTE — Assessment & Plan Note (Addendum)
 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  He did not need adjuvant treatment at that time.  He has been incarcerated intermittently and has not had regular follow-ups.    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 Biopsy indicates intestinal origin. The tumor is extensive and encases the spine, precluding surgical removal.  He reports no major symptoms such as diarrhea, palpitations, or wheezing.   He presented to our clinic on 05/26/2024 to establish care.  Request placed for PET dotatate scan.  Given the extent of disease, he is not a surgical candidate. No carcinoid symptoms currently.  PET dotatate scan on 06/02/2024 showed intense radiotracer activity associated with the soft tissue sacral lesion consistent with Well differentiated neuroendocrine tumor. 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 Radiotracer avid lesion between the head of the pancreas and the second portion duodenum. Differential include well differentiated neuroendocrine tumor in the ampullary region of the pancreas/duodenum versus Neuroendocrine tumor metastatic lymph node. Small metastatic neuroendocrine tumor lymph node ventral to the aorta. No evidence of liver metastasis. No bowel lesion identified. No thoracic metastasis.  Given overall disease burden, plan is to proceed with monthly octreotide injections.  Tentative start date during the week of 06/16/2024.  We will discuss his case in multidisciplinary GI tumor conference in the next 2 weeks to see if radiation therapy would be an option.

## 2024-06-09 NOTE — Progress Notes (Signed)
 [facility]  ONCOLOGY CLINIC PROGRESS NOTE   Patient Care Team: Patient, [name] as PCP - General (General Practice)  PATIENT NAME: Carlos Sullivan   MR#: [id number] DOB: 05-Nov-1971  Date of visit: 06/09/2024   ASSESSMENT & PLAN:   Carlos Sullivan is a 53 y.o.  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.  He did not need adjuvant treatment at that time.  He has been incarcerated intermittently and has not had regular follow-ups.    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 Biopsy indicates intestinal origin. The tumor is extensive and encases the spine, precluding surgical removal.  He reports no major symptoms such as diarrhea, palpitations, or wheezing.   He presented to our clinic on 05/26/2024 to establish care.  Request placed for PET dotatate scan.  Given the extent of disease, he is not a surgical candidate. No carcinoid symptoms currently.  PET dotatate scan on 06/02/2024 showed intense radiotracer activity associated with the soft tissue sacral lesion consistent with Well differentiated neuroendocrine tumor. 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 Radiotracer avid lesion between the head of the pancreas and the second portion duodenum. Differential include well differentiated neuroendocrine tumor in the ampullary region of the pancreas/duodenum versus Neuroendocrine tumor metastatic lymph node.  Small metastatic neuroendocrine tumor lymph node ventral to the aorta. No evidence of liver metastasis. No bowel lesion identified. No thoracic metastasis.  Given overall disease burden, plan is to proceed with monthly octreotide injections.  Tentative start date during the week of 06/16/2024.  We will discuss his case in multidisciplinary GI tumor conference in the next 2 weeks to see if radiation therapy would be an option.   Acute deep vein thrombosis (DVT) of femoral vein of left lower extremity (HCC) DVT in the leg with improving swelling. He is currently on Eliquis , with dosage reduced to one tablet twice daily. Anticoagulation will continue for at least six months to prevent further thrombotic events.   - Send prescription for Eliquis  with refills to his pharmacy   - Continue Eliquis  for at least six months   - Coordinate with social workers for assistance with insurance and benefits.   I reviewed lab results and outside records for this visit and discussed relevant results with the patient. Diagnosis, plan of care and treatment options were also discussed in detail with the patient. Opportunity provided to ask questions and answers provided to his apparent satisfaction. Provided instructions to call our clinic with any problems, questions or concerns prior to return visit. I recommended to continue follow-up with PCP and sub-specialists. He verbalized understanding and agreed with the plan.   NCCN guidelines have been consulted in the planning of this patient's care.  I spent a total of 30 minutes during this encounter with the patient including review of chart and various tests results, discussions about plan of care and coordination of care plan.   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  Metastatic disease to sacrum, T11, L5, sacral vertebral bodies, abdominal lymph node.  Current Treatment: Plan to start him on monthly octreotide injections, given the amount of disease burden.  INTERVAL HISTORY:    Discussed the use of AI scribe software for clinical note transcription with the patient, who gave verbal consent to proceed.  History of Present Illness Carlos Sullivan is a 53 year old male with neuroendocrine tumors who presents for follow-up regarding his medication and disease status.  He is managing his medication needs and has been using a starter pack for his prescription, with about three days remaining. He is coordinating with his case manager to obtain a voucher for medication coverage due to being under [employer] custody. His prescription was recently sent to a [facility] after an urgent care visit, but he is considering transferring it to a community pharmacy for convenience.  He has a history of neuroendocrine tumors with recent imaging revealing a tumor in the sacrum, additional disease in the T11 and L5 vertebrae, and a spot between the pancreas and duodenum. Previously, he had a small tumor in the small intestine and lymph node involvement in that area. There is no disease in the chest or liver, and the disease appears to be bone predominant.  He experiences occasional pain in the lower back when touched in a certain spot but denies constant pain. No diarrhea, wheezing, palpitations, dizziness, or issues with bowel or bladder control. Bowel movements are regular without pain, and there is no blood in stools or other bleeding. He reports occasional facial flushing.  He is currently under [employer] custody and resides in a halfway house, with plans to leave in August.    I have reviewed the past medical history, past surgical history, social history and  family history with the patient and they are unchanged from previous note.  HISTORY OF PRESENT ILLNESS:   ONCOLOGY HISTORY:   53 y.o. 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  He was a prisoner at that time and hence records were not available to review.   He presented to the ED on 05/13/2024 with complaints of left leg that was worsening over the last 3 weeks prior to presentation.   Patient initially reported to the ER on 5/13 for left leg swelling worsened over the last 3 weeks but appears he left before being seen.  Patient then came back to the ER on 5/14 where he had an ultrasound of his leg that was negative for DVTs.  He also had CT angio aorta plus BiFem that showed a tumor in his spinal canal.     On 05/12/2024, pelvic/lumbar MRI showed extensive tumor occupying the lower spinal canal.  The spinal canal was markedly widened and tumor extends out through the sacral foramen.  Osseous lesions in the T12 and L5 vertebra concerning for osseous metastatic disease. Patient had no associated lymphadenopathy.     CT scan showed left common femoral vein and iliac vein as well as left femoral vein were thrombosed.  He then again left AGAINST MEDICAL ADVICE due to needing to report back to his halfway house.  He was given a dose of Lovenox .  Patient came back  to the ER on 5/15 where the ER reached out to both neurosurgery and oncology.  Neurosurgery recommended a CT chest abdomen pelvis for metastatic workup and IR guided biopsy for further treatment.    On 05/13/2024, CT chest, abdomen and pelvis showed at least 2 ill-defined hypoattenuating masses in the right hepatic lobe, segment 5, incompletely characterized.  No metastatic disease identified within the chest.   On 05/14/2024, he underwent CT-guided biopsy of the sacral soft tissue mass.  Pathology showed metastatic well-differentiated neuroendocrine tumor, consistent with  a gastrointestinal primary. Immunohistochemical stains show that the tumor cells are positive for synaptophysin chromogranin and CDX2 with dot-like cytoplasmic labeling  on cytokeratin AE1/AE3.  Immunostains for CD138, MUM 1, TTF-1, kappa and lambda light chains are negative. Immunostain for Ki-67 shows a low proliferative index (1-2%), consistent with a low-grade tumor.    He presented to our clinic on 05/26/2024 to establish care.  Request placed for PET dotatate scan.  Given the extent of disease, he is not a surgical candidate.  Hence we will discuss his case in multidisciplinary conference to see if radiation therapy would be an option.  No carcinoid symptoms currently.  PET dotatate scan on 06/02/2024 showed intense radiotracer activity associated with the soft tissue sacral lesion consistent with Well differentiated neuroendocrine tumor. 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 Radiotracer avid lesion between the head of the pancreas and the second portion duodenum. Differential include well differentiated neuroendocrine tumor in the ampullary region of the pancreas/duodenum versus Neuroendocrine tumor metastatic lymph node. Small metastatic neuroendocrine tumor lymph node ventral to the aorta. No evidence of liver metastasis. No bowel lesion identified. No thoracic metastasis.  Given overall disease burden, plan is to proceed with monthly octreotide injections.  Tentative start date during the week of 06/16/2024.  REVIEW OF SYSTEMS:   Review of Systems - Oncology  All other pertinent systems were reviewed with the patient and are negative.  ALLERGIES: He is allergic to bee venom.  MEDICATIONS:  Current Outpatient Medications  Medication Sig Dispense Refill   Buprenorphine  HCl-Naloxone  HCl 8-2 MG FILM Place 1 Film under the tongue in the morning, at noon, and at bedtime.     losartan (COZAAR) 25 MG tablet Take 25 mg by mouth daily.      apixaban  (ELIQUIS ) 5 MG TABS tablet Take 1 tablet (5 mg total) by mouth 2 (two) times daily. 180 tablet 1   oxyCODONE  (OXY IR/ROXICODONE ) 5 MG immediate release tablet Take 1 tablet (5 mg total) by mouth every 6 (six) hours as needed for breakthrough pain. (Patient not taking: Reported on 06/09/2024) 15 tablet 0   No current facility-administered medications for this visit.     VITALS:   Blood pressure 114/63, pulse 71, temperature 97.9 F (36.6 C), temperature source Temporal, resp. rate 16, height 6' 3 (1.905 m), weight 208 lb (94.3 kg), SpO2 100%.  Wt Readings from Last 3 Encounters:  06/09/24 208 lb (94.3 kg)  05/26/24 211 lb 9.6 oz (96 kg)  05/13/24 216 lb 0.8 oz (98 kg)    Body mass index is 26 kg/m.    Onc Performance Status - 06/09/24 1436       ECOG Perf Status   ECOG Perf Status Fully active, able to carry on all pre-disease performance without restriction      KPS SCALE   KPS % SCORE Normal, no compliants, no evidence of disease  PHYSICAL EXAM:   Physical Exam Constitutional:      General: He is not in acute distress.    Appearance: Normal appearance.  HENT:     Head: Normocephalic and atraumatic.  Eyes:     Conjunctiva/sclera: Conjunctivae normal.  Cardiovascular:     Rate and Rhythm: Normal rate and regular rhythm.     Heart sounds: Normal heart sounds.  Pulmonary:     Effort: Pulmonary effort is normal. No respiratory distress.     Breath sounds: Normal breath sounds.  Abdominal:     General: There is no distension.  Musculoskeletal:     Right lower leg: No edema.     Left lower leg: No edema.  Neurological:     General: No focal deficit present.     Mental Status: He is alert and oriented to person, place, and time.  Psychiatric:        Mood and Affect: Mood normal.        Behavior: Behavior normal.       LABORATORY DATA:   I have reviewed the data as listed.  Results for orders placed or performed in visit on 06/09/24   Lactate dehydrogenase  Result Value Ref Range   LDH 127 98 - 192 U/L  CMP (Cancer Center only)  Result Value Ref Range   Sodium 141 135 - 145 mmol/L   Potassium 4.1 3.5 - 5.1 mmol/L   Chloride 109 98 - 111 mmol/L   CO2 28 22 - 32 mmol/L   Glucose, Bld 88 70 - 99 mg/dL   BUN 9 6 - 20 mg/dL   Creatinine 1.61 0.96 - 1.24 mg/dL   Calcium 9.2 8.9 - 04.5 mg/dL   Total Protein 7.4 6.5 - 8.1 g/dL   Albumin 4.1 3.5 - 5.0 g/dL   AST 14 (L) 15 - 41 U/L   ALT 9 0 - 44 U/L   Alkaline Phosphatase 69 38 - 126 U/L   Total Bilirubin 0.5 0.0 - 1.2 mg/dL   GFR, Estimated >40 >98 mL/min   Anion gap 4 (L) 5 - 15  CBC with Differential (Cancer Center Only)  Result Value Ref Range   WBC Count 4.4 4.0 - 10.5 K/uL   RBC 4.14 (L) 4.22 - 5.81 MIL/uL   Hemoglobin 11.7 (L) 13.0 - 17.0 g/dL   HCT 11.9 (L) 14.7 - 82.9 %   MCV 86.0 80.0 - 100.0 fL   MCH 28.3 26.0 - 34.0 pg   MCHC 32.9 30.0 - 36.0 g/dL   RDW 56.2 13.0 - 86.5 %   Platelet Count 155 150 - 400 K/uL   nRBC 0.0 0.0 - 0.2 %   Neutrophils Relative % 57 %   Neutro Abs 2.5 1.7 - 7.7 K/uL   Lymphocytes Relative 29 %   Lymphs Abs 1.3 0.7 - 4.0 K/uL   Monocytes Relative 10 %   Monocytes Absolute 0.4 0.1 - 1.0 K/uL   Eosinophils Relative 3 %   Eosinophils Absolute 0.1 0.0 - 0.5 K/uL   Basophils Relative 1 %   Basophils Absolute 0.0 0.0 - 0.1 K/uL   Immature Granulocytes 0 %   Abs Immature Granulocytes 0.01 0.00 - 0.07 K/uL      RADIOGRAPHIC STUDIES:  I have personally reviewed the radiological images as listed and agree with the findings in the report.  NM PET DOTATATE SKULL BASE TO MID THIGH Result Date: 06/03/2024 CLINICAL DATA:  Neuroendocrine tumor of the sacrum. EXAM: NUCLEAR MEDICINE PET SKULL BASE  TO THIGH TECHNIQUE: 4.3 mCi copper  60 DOTATATE was injected intravenously. Full-ring PET imaging was performed from the skull base to thigh after the radiotracer. CT data was obtained and used for attenuation correction and anatomic  localization. COMPARISON:  Pelvic MRI 05/12/2024 CT 05/14/2019 FINDINGS: NECK No radiotracer activity in neck lymph nodes. Incidental CT findings: None CHEST No radiotracer accumulation within mediastinal or hilar lymph nodes. No suspicious pulmonary nodules on the CT scan. Incidental CT finding:None ABDOMEN/PELVIS Intense metabolic activity associated with the soft tissue sacral lesion described on comparison MRI. Activity is intense SUV max equal 44 (image 170). Probable involvement of adjacent anterior sacral vertebral bodies. There is a focal lesion in the L5 vertebral body SUV max equal 31 on image 167. Additional skeletal metastatic lesion within the RIGHT pedicle of the T11 vertebral body SUV max equal 49 on image 121. No clear CT change identified. There is no focal abnormal activity in liver. No abnormal activity the pancreas. No abnormal activity in the bowel. There is a radiotracer avid lesion position between the head of the pancreas and the second portion duodenum with SUV max equal 17.8 on image 133. No clear lesion identified on noncontrast CT or comparison diagnostic CT. Second small radiotracer avid lesion just ventral to the aorta the upper abdomen on image 140 with SUV max equal 10.5. This presumably represents a small lymph node metastasis. Physiologic activity noted in the liver, spleen, adrenal glands and kidneys. Incidental CT findings:Postcholecystectomy. SKELETON See skeletal lesions described above. Incidental CT findings:None IMPRESSION: 1. Intense radiotracer activity associated with the soft tissue sacral lesion consistent with Well differentiated neuroendocrine tumor. 2. 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 3. Radiotracer avid lesion between the head of the pancreas and the second portion duodenum. Differential include well differentiated neuroendocrine tumor in the ampullary region of the pancreas/duodenum versus  Neuroendocrine tumor metastatic lymph node. 4. small metastatic neuroendocrine tumor lymph node ventral to the aorta. 5. No evidence of liver metastasis. No bowel lesion identified. No thoracic metastasis Electronically Signed   By: Deboraha Fallow M.D.   On: 06/03/2024 10:25   CT BIOPSY Result Date: 05/15/2024 CLINICAL DATA:  Lytic soft tissue mass of the sacrum, no known primary. EXAM: CT GUIDED CORE BIOPSY OF SACRUM ANESTHESIA/SEDATION: Intravenous Fentanyl  100mcg and Versed  2mg  were administered by RN during a total moderate (conscious) sedation time of 15 minutes; the patient's level of consciousness and physiological / cardiorespiratory status were monitored continuously by radiology RN under my direct supervision. PROCEDURE: The procedure risks, benefits, and alternatives were explained to the patient. Questions regarding the procedure were encouraged and answered. The patient understands and consents to the procedure. patient placed prone. select axial scans through the sacrum and pelvis were obtained. the lesion was localized and an appropriate skin entry site was determined and marked. The operative field was prepped with chlorhexidinein a sterile fashion, and a sterile drape was applied covering the operative field. A sterile gown and sterile gloves were used for the procedure. Local anesthesia was provided with 1% Lidocaine. Under CT fluoroscopic guidance, an 17 gauge trocar needle was advanced to the margin of the lesion taking shallow approach to avoid the spinal canal and spinal nerves. Coaxial 18 gauge core biopsy samples were obtained, submitted in saline to surgical pathology. Postprocedure scans show no hemorrhage or other apparent complication. The patient tolerated the procedure well. COMPLICATIONS: None immediate FINDINGS: Lytic soft tissue lesion involving the sacrum and sacral foramina was localized.  Representative core biopsy samples obtained as above. IMPRESSION: 1. Technically  successful CT-guided core biopsy, sacral lesion. RADIATION DOSE REDUCTION: This exam was performed according to the departmental dose-optimization program which includes automated exposure control, adjustment of the mA and/or kV according to patient size and/or use of iterative reconstruction technique. Electronically Signed   By: Nicoletta Barrier M.D.   On: 05/15/2024 16:54   CT CHEST ABDOMEN PELVIS W CONTRAST Result Date: 05/13/2024 CLINICAL DATA:  Metastatic disease evaluation. History of sacral mass. * Tracking Code: BO * EXAM: CT CHEST, ABDOMEN, AND PELVIS WITH CONTRAST TECHNIQUE: Multidetector CT imaging of the chest, abdomen and pelvis was performed following the standard protocol during bolus administration of intravenous contrast. RADIATION DOSE REDUCTION: This exam was performed according to the departmental dose-optimization program which includes automated exposure control, adjustment of the mA and/or kV according to patient size and/or use of iterative reconstruction technique. CONTRAST:  OMNIPAQUE  IOHEXOL  300 MG/ML  SOLN COMPARISON:  MRI lumbar spine, MRI pelvis and CT angio runoff from 05/12/2024. FINDINGS: CT CHEST FINDINGS Cardiovascular: Normal cardiac size. No pericardial effusion. No aortic aneurysm. Mediastinum/Nodes: Visualized thyroid  gland appears grossly unremarkable. No solid / cystic mediastinal masses. The esophagus is nondistended precluding optimal assessment. There are few mildly prominent mediastinal lymph nodes, which do not meet the size criteria for lymphadenopathy and though indeterminate most likely benign in etiology. No axillary or hilar lymphadenopathy by size criteria. Lungs/Pleura: The central tracheo-bronchial tree is patent. No mass or consolidation. No pleural effusion or pneumothorax. No suspicious lung nodules. Musculoskeletal: The visualized soft tissues of the chest wall are grossly unremarkable. No suspicious osseous lesions. There are mild multilevel degenerative  changes in the visualized spine. CT ABDOMEN PELVIS FINDINGS Hepatobiliary: The liver is normal in size. Non-cirrhotic configuration. There are at least 2, ill-defined hypoattenuating masses in the right hepatic lobe, segment 5, which are incompletely characterized on the current exam but suspicious. Further evaluation fifth multiphasic contrast-enhanced MRI abdomen as per liver mass protocol is recommended. No intrahepatic or extrahepatic bile duct dilation. Gallbladder is surgically absent. Pancreas: Unremarkable. No pancreatic ductal dilatation or surrounding inflammatory changes. Spleen: Within normal limits. No focal lesion. Adrenals/Urinary Tract: Adrenal glands are unremarkable. No suspicious renal mass. No hydronephrosis. No renal or ureteric calculi. Urinary bladder is under distended, precluding optimal assessment. However, no large mass or stones identified. No perivesical fat stranding. Stomach/Bowel: No disproportionate dilation of the small or large bowel loops. No evidence of abnormal bowel wall thickening or inflammatory changes. The appendix is unremarkable. Bowel anastomotic suture noted in the right lower quadrant. Vascular/Lymphatic: No ascites or pneumoperitoneum. No abdominal or pelvic lymphadenopathy, by size criteria. No aneurysmal dilation of the major abdominal arteries. Reproductive: Normal size prostate. Symmetric seminal vesicles. Other: Periumbilical midline surgical scar noted. There is mild anasarca. There are soft tissue density areas in the anterior abdominal wall subcutaneous tissue, most likely due to medication injections. Musculoskeletal: There is focal expansion of the upper sacral spinal canal with remodeling of the upper sacrum. There are sclerotic areas within the upper sacrum. 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 Please refer to MRI lumbar spine report from 05/12/2024 for details. IMPRESSION: 1. There are at least 2,  ill-defined hypoattenuating masses in the right hepatic lobe, segment 5, which are incompletely characterized on the current exam but suspicious. Further evaluation fifth multiphasic contrast-enhanced MRI abdomen as per liver mass protocol is recommended. 2. No metastatic disease identified within the chest. 3. Redemonstration  of patient's known upper sacral mass. Please refer to MRI lumbar spine and MRI pelvis report of from yesterday for details. 4. Multiple other nonacute observations, as described above. Electronically Signed   By: Beula Brunswick M.D.   On: 05/13/2024 15:13   MR PELVIS W WO CONTRAST Result Date: 05/12/2024 CLINICAL DATA:  Sacral mass seen on recent CT scan and lumbar spine MRI. EXAM: MRI PELVIS WITHOUT AND WITH CONTRAST TECHNIQUE: Multiplanar multisequence MR imaging of the pelvis was performed both before and after administration of intravenous contrast. CONTRAST:  10mL GADAVIST  GADOBUTROL  1 MMOL/ML IV SOLN COMPARISON:  CT scan 05/12/2024 and MRI lumbar spine 05/12/2024 FINDINGS: Urinary Tract: The bladder is unremarkable. No bladder mass or calculi. Bowel: Moderate pericolonic inflammatory changes but no rectal or sigmoid colon mass is identified. The visualized pelvic small bowel loops are unremarkable. Vascular/Lymphatic: The left common femoral vein, external iliac vein and left femoral vein are thrombosed. Associated surrounding inflammatory changes. This likely accounts for the patient's massive subcutaneous edema involving the left hip and thigh region. No overt lymphadenopathy the. Reproductive:  The prostate gland seminal vesicles are unremarkable. Other: Extensive tumor occupying the spinal canal as noted on the recent MRI lumbar spine. The tumor begins at the L5-S1 disc space and extends down to the bottom of S3. The spinal canal is markedly widened and tumor extends out through the sacral neural foramen. There is also involvement of all of the bony sacral segments with tumor and  lumbar spine lesions are also noted. However, there is no destructive bony changes or obvious cortical erosion. There is also enhancing presacral tumor at the same levels. No associated lymphadenopathy but lymphoma is certainly a possibility. Musculoskeletal: Infiltrating lumbar and sacral lesions without destructive cortical changes possibly reflecting a process such as lymphoma. IMPRESSION: 1. 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 The spinal canal is markedly widened and tumor extends out through the sacral neural foramen. There is also involvement of all of the bony sacral segments with tumor and lumbar spine lesions are also noted. However, there are no destructive bony changes or obvious cortical erosion. There is also enhancing presacral tumor at the same levels. No associated lymphadenopathy but lymphoma is certainly a possibility. 2. The left common femoral vein, external iliac vein and left femoral vein are thrombosed. Associated surrounding inflammatory changes. This likely accounts for the patient's massive subcutaneous edema involving the left hip and thigh region. Electronically Signed   By: Marrian Siva M.D.   On: 05/12/2024 21:36   MR Lumbar Spine W Wo Contrast Result Date: 05/12/2024 CLINICAL DATA:  Metastatic disease evaluation, lower back pain radiating into the left glute muscles. Left leg swelling. EXAM: MRI LUMBAR SPINE WITHOUT AND WITH CONTRAST TECHNIQUE: Multiplanar and multiecho pulse sequences of the lumbar spine were obtained without and with intravenous contrast. CONTRAST:  10mL GADAVIST  GADOBUTROL  1 MMOL/ML IV SOLN COMPARISON:  None Available. FINDINGS: Segmentation:  Standard. Alignment: Lumbar lordosis is maintained. Grade 1 anterolisthesis of L5 on S1. Vertebrae: There is a T1 hypointense, stir hyperintense lesion in the right posterior aspect of T12 which demonstrates mild enhancement. Lesion partially extends into the right pedicle of T12. Additional similar appearing  lesion within the right anterior inferior aspect of the L5 vertebra. Irregularity of the L4 superior endplate with associated mild edema and enhancement likely reflecting Schmorl's nodes. Additional Modic type 1 degenerative endplate changes anteriorly at L3-4 with mild associated discogenic edema. Additional remote Schmorl's nodes in the lower thoracic and upper lumbar spine.  Vertebral body heights otherwise maintained. There is a mildly enhancing STIR hyperintense mass along the dorsal aspect of the sacrum which measures 8.1 x 2.6 cm in maximum dimensions on sagittal images. There is additional signal abnormality within the visualized S1-S3 sacral vertebra with associated enhancement. Conus medullaris and cauda equina: Conus extends to the L1-2 level. Conus and cauda equina appear normal. Paraspinal and other soft tissues: There is presacral edema and enhancement which is partially visualized. The paraspinal musculature is otherwise unremarkable. Disc levels: T12-L1: Minimal disc bulge. Facet arthrosis indents the dorsal thecal sac. No significant spinal canal stenosis. No significant foraminal stenosis. L1-2: Mild facet arthrosis indents the dorsal thecal sac. No significant spinal canal stenosis. No significant foraminal stenosis. L2-3: Minimal disc bulge which indents the ventral thecal sac with lateral recess narrowing without impingement upon the traversing nerve roots. Moderate facet arthrosis indents the dorsal thecal sac. Mild spinal canal stenosis. There is mild foraminal stenosis on the right. L3-4: Disc desiccation and moderate disc height loss. Diffuse disc bulge eccentric to the right which indents the ventral thecal sac with lateral recess narrowing more pronounced on the right. There is likely impingement upon the traversing right L4 nerve roots. Additional right paracentral component of disc extrusion with approximately 4 mm cranial migration of disc contents. Mild facet arthrosis indents the  dorsal thecal sac. Mild spinal canal stenosis. There is moderate bilateral foraminal stenosis. Disc bulge likely contacts the exiting bilateral L3 nerve roots. L4-5: Small disc bulge resulting in lateral recess narrowing without significant impingement upon the traversing nerve roots. Moderate to severe facet arthrosis which indents the dorsal thecal sac. Mild spinal canal stenosis. There is mild foraminal stenosis on the left. L5-S1: Grade 1 anterolisthesis with partial uncovering of the disc. Diffuse disc bulge indents the ventral thecal sac with lateral recess narrowing without significant impingement upon the traversing nerve roots. Severe facet arthrosis indents the dorsal thecal sac. There is thickening of the ligamentum flavum which appears edematous with associated enhancement. Mild-to-moderate spinal canal stenosis. There is moderate bilateral foraminal stenosis. Disc bulge abuts the bilateral L5 nerve roots. Mass along the dorsal aspect of the sacrum abuts the ventral and inferior aspect of the thecal sac just below the level of the L5-S1 disc without evidence of extension into the thecal sac. The mass extends into the bilateral S1 sacral foramina in appears to displace the sacral nerve roots superiorly within the foramina. The tumor appears to envelop the nerve roots. Enhancement along the visualized sacral nerve roots concerning for perineural spread. 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 Large soft tissue mass along the dorsal aspect of the sacrum extending from the superior margin of S1 to S3. Associated bone marrow signal abnormality and enhancement of the visualized S1 through S3 vertebra concerning for additional sites of osseous metastatic disease. Soft tissue mass extends into the visualized S1 sacral foramina and appears to envelop the bilateral S1 nerve roots. Partially visualized presacral edema and enhancement. Degenerative changes of  the lumbar spine as above. No high-grade spinal canal stenosis. Lateral recess narrowing at multiple levels. Disc bulge at L3-4 likely results in impingement of the traversing right L4 nerve roots. Multilevel foraminal stenosis, greatest and moderate bilaterally at L3-4 and L5-S1. Disc bulges at L3-4 and L5-S1 likely abut the exiting L3 and L5 nerve roots respectively. Electronically Signed   By: Denny Flack M.D.   On: 05/12/2024 20:25   CT ANGIO AO+BIFEM W & OR WO  CONTRAST Result Date: 05/12/2024 CLINICAL DATA:  Left leg swelling, ?phlegmasia EXAM: CT ANGIOGRAPHY CHEST WITH CONTRAST TECHNIQUE: Multidetector CT imaging of the abdomen and pelvis with run-off to both lower extremities was performed using the standard protocol during bolus administration of intravenous contrast. Multiplanar CT image reconstructions and MIPs were obtained to evaluate the vascular anatomy. RADIATION DOSE REDUCTION: This exam was performed according to the departmental dose-optimization program which includes automated exposure control, adjustment of the mA and/or kV according to patient size and/or use of iterative reconstruction technique. CONTRAST:  OMNIPAQUE  IOHEXOL  350 MG/ML SOLN COMPARISON:  None Available. FINDINGS: VASCULAR Aorta: No aneurysm or aortic dissection. No acute thrombus. Celiac: Patent without aneurysm, dissection, or hemodynamically significant stenosis. SMA: Patent without aneurysm, dissection, or hemodynamically significant stenosis. Renals: Patent without aneurysm, dissection, or hemodynamically significant stenosis. IMA: Patent without aneurysm, dissection, or hemodynamically significant stenosis. RIGHT Lower Extremity Inflow: Common, internal and external iliac arteries are patent without aneurysm, acute thrombus, or dissection. No plaque or hemodynamically significant stenosis. Outflow: Common, superficial and profunda femoral, and the popliteal arteries are patent without acute thrombus, aneurysm, or  dissection. No plaque or hemodynamically significant stenosis. Runoff: Patent three vessel runoff to the foot. LEFT Lower Extremity Inflow: Common, internal and external iliac arteries are patent without aneurysm, acute thrombus, or dissection. No plaque or hemodynamically significant stenosis. Outflow: Common, superficial and profunda femoral, and the popliteal arteries are patent without acute thrombus, aneurysm, or dissection. No plaque or hemodynamically significant stenosis. Runoff: Patent three vessel runoff to the foot. Veins: Not evaluated due to the phase of contrast. Review of the MIP images confirms the above findings. NON-VASCULAR Lower chest: No focal airspace consolidation or pleural effusion. Hepatobiliary: No mass.Cholecystectomy.No intrahepatic or extrahepatic biliary ductal dilation. Pancreas: No mass or main ductal dilation.No peripancreatic inflammation or fluid collection. Spleen: Normal size. No mass. Adrenals/Urinary Tract: No adrenal masses. No renal mass. No hydronephrosis or nephrolithiasis. Circumferential wall thickening of the urinary bladder. Stomach/Bowel: The stomach is decompressed without focal abnormality. No small bowel wall thickening or inflammation. No small bowel obstruction. The appendix was not visualized. No right lower quadrant or pericecal inflammatory changes to suggest acute appendicitis. Mucosal hyperenhancement and mild wall thickening in the rectosigmoid colon. Presacral stranding also present. Vascular/Lymphatic: Mildly enlarged left external iliac chain lymph node measuring 1.3 cm (axial 219). Reproductive: No prostatomegaly.no free pelvic fluid. Other: No pneumoperitoneum, ascites, or mesenteric inflammation. Musculoskeletal: No acute fracture. Soft tissue mass within the spinal canal causing bony expansion and overlying destruction of the sacrum, centered at S2, measuring approximately 3.1 x 5.3 x 5.2 cm (axial 196). This causes significant narrowing of the S2  neural foramina.Moderate diffuse subcutaneous edema throughout the left leg with diffuse skin thickening present. Multilevel degenerative disc disease of the spine. IMPRESSION: VASCULAR 1. No aortic aneurysm or aortic dissection. 2. No acute thrombus or hemodynamically significant stenosis within the inflow or outflow vessels to either leg. Patent three-vessel runoff to the feet bilaterally. NON-VASCULAR 1. Mucosal hyperenhancement and mild wall thickening of the rectosigmoid colon with presacral stranding, which may reflect changes of an infectious or inflammatory proctocolitis. 2. Soft tissue mass within the spinal canal causing bony expansion and overlying destruction of the sacral bone, centered at S2, measuring approximately 3.1 x 5.3 x 5.2 cm. This is nonspecific, worrisome for underlying neoplasm. A follow-up pelvic MRI with IV contrast is recommended for further characterization. Electronically Signed   By: Rance Burrows M.D.   On: 05/12/2024 13:53   VAS US  LOWER  EXTREMITY VENOUS (DVT) (7a-7p) Result Date: 05/12/2024  Lower Venous DVT Study Patient Name:  PAYTON PRINSEN  Date of Exam:   05/12/2024 Medical Rec #: [id number]         Accession #:    [id number] Date of Birth: 1971-07-06          Patient Gender: M Patient Age:   50 years Exam Location:  [facility] Procedure:      VAS US  LOWER EXTREMITY VENOUS (DVT) Referring Phys: Russella Courts --------------------------------------------------------------------------------  Indications: Swelling.  Risk Factors: None identified. Limitations: Poor ultrasound/tissue interface. Comparison Study: No prior studies. Performing Technologist: Lerry Ransom RVT  Examination Guidelines: A complete evaluation includes B-mode imaging, spectral Doppler, color Doppler, and power Doppler as needed of all accessible portions of each vessel. Bilateral testing is considered an integral part of a complete examination. Limited examinations for reoccurring indications  may be performed as noted. The reflux portion of the exam is performed with the patient in reverse Trendelenburg.  +-----+---------------+---------+-----------+----------+--------------+ RIGHTCompressibilityPhasicitySpontaneityPropertiesThrombus Aging +-----+---------------+---------+-----------+----------+--------------+ CFV  Full           Yes      Yes                                 +-----+---------------+---------+-----------+----------+--------------+   +---------+---------------+---------+-----------+----------+-------------------+ LEFT     CompressibilityPhasicitySpontaneityPropertiesThrombus Aging      +---------+---------------+---------+-----------+----------+-------------------+ CFV      Full           Yes      Yes                                      +---------+---------------+---------+-----------+----------+-------------------+ SFJ      Full                                                             +---------+---------------+---------+-----------+----------+-------------------+ FV Prox  Full                                                             +---------+---------------+---------+-----------+----------+-------------------+ FV Mid                  Yes      Yes                                      +---------+---------------+---------+-----------+----------+-------------------+ FV Distal               Yes      Yes                                      +---------+---------------+---------+-----------+----------+-------------------+ PFV      Full                                                             +---------+---------------+---------+-----------+----------+-------------------+  POP      Full           Yes      Yes                                      +---------+---------------+---------+-----------+----------+-------------------+ PTV      Full                                                              +---------+---------------+---------+-----------+----------+-------------------+ PERO                                                  Not well visualized +---------+---------------+---------+-----------+----------+-------------------+    Summary: RIGHT: - No evidence of common femoral vein obstruction.   LEFT: - There is no evidence of deep vein thrombosis in the lower extremity. However, portions of this examination were limited- see technologist comments above.  - No cystic structure found in the popliteal fossa.  *See table(s) above for measurements and observations. Electronically signed by Jimmye Moulds MD on 05/12/2024 at 10:25:20 AM.    Final     CODE STATUS:  Code Status History     Date Active Date Inactive Code Status Order ID Comments User Context   05/13/2024 1147 05/15/2024 2350 Full Code [id number]  Enrigue Harvard, DO ED    Questions for Most Recent Historical Code Status (Order [id number])     Question Answer   By: Consent: discussion documented in EHR            Orders Placed This Encounter  Procedures   CBC with Differential (Cancer Center Only)    Standing Status:   Future    Expiration Date:   06/09/2025   CMP (Cancer Center only)    Standing Status:   Future    Expiration Date:   06/09/2025   D-dimer, quantitative    Standing Status:   Future    Expiration Date:   06/09/2025     This document was completed utilizing speech recognition software. Grammatical errors, random word insertions, pronoun errors, and incomplete sentences are an occasional consequence of this system due to software limitations, ambient noise, and hardware issues. Any formal questions or concerns about the content, text or information contained within the body of this dictation should be directly addressed to the provider for clarification.

## 2024-06-10 ENCOUNTER — Telehealth: Payer: Self-pay | Admitting: *Deleted

## 2024-06-10 ENCOUNTER — Other Ambulatory Visit (HOSPITAL_COMMUNITY): Payer: Self-pay

## 2024-06-10 NOTE — Telephone Encounter (Signed)
 Secure Chat popped up upon signing in to [employer] today.  Added to messages to assist  pt lives in halfway house. Needs voucher to pick up meds. Pharmacist said we need a pre-auth from physician to get this paid for.  Information below performed by this nurse and sent as response to Secure Chat message received.  No further actions performed or required by this nurse.  Called [place] [employer].  He does not have coverage.  Left message for pt. to call to discuss Medicaid and any other prescription coverage.  Checked Needymeds.com for coupons.  He does not qualify.  Only commercial insurance carriers do,  Submitted his name, D.O.B. and email address [email or web address] on-line for the Eliquis  360 support program to learn of other ways to save Phone no.#: [phone].  Whatever is ordered, he will have to pay out of pocket.

## 2024-06-23 ENCOUNTER — Other Ambulatory Visit: Payer: Self-pay

## 2024-06-23 NOTE — Progress Notes (Signed)
 The proposed treatment discussed in conference is for discussion purpose only and is not a binding recommendation.  The patients have not been physically examined, or presented with their treatment options.  Therefore, final treatment plans cannot be decided.

## 2024-07-13 ENCOUNTER — Telehealth: Payer: Self-pay

## 2024-07-13 NOTE — Telephone Encounter (Signed)
 [facility] and myself have tried to contact the patient on multiple occasions to get information and signatures for PAP enrollment of Sandostatin. Patient uncooperative and hangs up each time, is under the impression we are trying to sell him insurance. I have attempted to explain we are trying to enroll in a free drug program through the company since he is uninsured and that I work for [employer]. Verified with Nathanel Sharps that Sandostatin was not covered under the BOP program he had while incarcerated. Patient will need to complete enrollment forms for PAP enrollment, or will be billed cash price.

## 2024-09-01 ENCOUNTER — Encounter (HOSPITAL_COMMUNITY): Payer: Self-pay | Admitting: *Deleted

## 2024-09-01 ENCOUNTER — Other Ambulatory Visit: Payer: Self-pay

## 2024-09-01 ENCOUNTER — Emergency Department (HOSPITAL_COMMUNITY)
Admission: EM | Admit: 2024-09-01 | Discharge: 2024-09-01 | Disposition: A | Discharge status: Home / Self Care | Payer: Self-pay | Attending: Emergency Medicine | Admitting: Emergency Medicine

## 2024-09-01 ENCOUNTER — Emergency Department (HOSPITAL_COMMUNITY): Payer: Self-pay

## 2024-09-01 DIAGNOSIS — Z8589 Personal history of malignant neoplasm of other organs and systems: Secondary | ICD-10-CM | POA: Diagnosis not present

## 2024-09-01 DIAGNOSIS — I82502 Chronic embolism and thrombosis of unspecified deep veins of left lower extremity: Secondary | ICD-10-CM | POA: Insufficient documentation

## 2024-09-01 DIAGNOSIS — Z7901 Long term (current) use of anticoagulants: Secondary | ICD-10-CM | POA: Diagnosis not present

## 2024-09-01 DIAGNOSIS — M7989 Other specified soft tissue disorders: Secondary | ICD-10-CM | POA: Diagnosis present

## 2024-09-01 HISTORY — DX: Acute embolism and thrombosis of unspecified deep veins of unspecified lower extremity: I82.409

## 2024-09-01 LAB — CBC WITH DIFFERENTIAL/PLATELET
Abs Immature Granulocytes: 0.02 K/uL (ref 0.00–0.07)
Basophils Absolute: 0 K/uL (ref 0.0–0.1)
Basophils Relative: 1 %
Eosinophils Absolute: 0.2 K/uL (ref 0.0–0.5)
Eosinophils Relative: 3 %
HCT: 33.3 % — ABNORMAL LOW (ref 39.0–52.0)
Hemoglobin: 10.5 g/dL — ABNORMAL LOW (ref 13.0–17.0)
Immature Granulocytes: 0 %
Lymphocytes Relative: 16 %
Lymphs Abs: 1 K/uL (ref 0.7–4.0)
MCH: 27.3 pg (ref 26.0–34.0)
MCHC: 31.5 g/dL (ref 30.0–36.0)
MCV: 86.5 fL (ref 80.0–100.0)
Monocytes Absolute: 0.8 K/uL (ref 0.1–1.0)
Monocytes Relative: 14 %
Neutro Abs: 3.9 K/uL (ref 1.7–7.7)
Neutrophils Relative %: 66 %
Platelets: 136 K/uL — ABNORMAL LOW (ref 150–400)
RBC: 3.85 MIL/uL — ABNORMAL LOW (ref 4.22–5.81)
RDW: 14.1 % (ref 11.5–15.5)
WBC: 5.9 K/uL (ref 4.0–10.5)
nRBC: 0 % (ref 0.0–0.2)

## 2024-09-01 LAB — BASIC METABOLIC PANEL WITH GFR
Anion gap: 11 (ref 5–15)
BUN: 12 mg/dL (ref 6–20)
CO2: 25 mmol/L (ref 22–32)
Calcium: 8.5 mg/dL — ABNORMAL LOW (ref 8.9–10.3)
Chloride: 103 mmol/L (ref 98–111)
Creatinine, Ser: 0.84 mg/dL (ref 0.61–1.24)
GFR, Estimated: >60 mL/min (ref >60)
Glucose, Bld: 90 mg/dL (ref 70–99)
Potassium: 3.8 mmol/L (ref 3.5–5.1)
Sodium: 139 mmol/L (ref 135–145)

## 2024-09-01 MED ORDER — APIXABAN (ELIQUIS) VTE STARTER PACK (10MG AND 5MG): ORAL_TABLET | ORAL | 0 refills | Status: DC

## 2024-09-01 NOTE — ED Triage Notes (Signed)
 Pt states he noticed swelling to his left leg from the thigh down;   Pt states he has some swelling occasionally but not as much

## 2024-09-01 NOTE — Discharge Instructions (Signed)
 You will need to complete the starter pack again with the increased dosing as discussed.  Plan close follow-up with your oncologist for further management of your blood clot.  Elevate your leg is much as comfortable, continue wearing your compression stocking when at work.  Get rechecked immediately for any chest pain, shortness of breath or any new or worsening symptoms.

## 2024-09-01 NOTE — ED Provider Notes (Signed)
 [place] EMERGENCY DEPARTMENT AT [facility] Provider Note   CSN: [id number] Arrival date & time: 09/01/24  9058     Patient presents with: Leg Swelling   Carlos Sullivan is a 53 y.o. 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  However since being off this medication he has noticed worsening swelling during the day as he stands with his job, improves by the next morning.  Over the past several days he has noticed swelling extending now into his left upper thigh.  He also endorses tightness and swelling of his right calf as well, it is less clear when the right calf sx began, he denies pain in his right extremity.  He denies shortness of breath, chest pain, pleurisy.  No recent injuries.   The history is provided by the patient.       Prior to Admission medications   Medication Sig Start Date End Date Taking? Authorizing Provider  acetaminophen  (TYLENOL ) 325 MG tablet Take 650 mg by mouth every 6 (six) hours as needed for moderate pain (pain score 4-6).   Yes [provider]  apixaban  (ELIQUIS ) 5 MG TABS tablet Take 1 tablet (5 mg total) by mouth 2 (two) times daily. 06/09/24  Yes Pasam, Avinash, MD  APIXABAN  (ELIQUIS ) VTE STARTER PACK (10MG  AND 5MG ) Take as directed on package: start with two-5mg  tablets twice daily for 7 days. On day 8, switch to one-5mg  tablet twice daily. 09/01/24  Yes Kenly Xiao, Mliss, PA-C  Buprenorphine  HCl-Naloxone  HCl 8-2 MG FILM Place 1 Film under the tongue in the morning, at noon, and at bedtime. 04/24/24  Yes [provider]    Allergies: Bee venom    Review of Systems  Constitutional:  Negative for chills and fever.  HENT:  Negative for  congestion.   Eyes: Negative.   Respiratory:  Negative for chest tightness and shortness of breath.   Cardiovascular:  Positive for leg swelling. Negative for chest pain and palpitations.  Gastrointestinal:  Negative for abdominal pain, nausea and vomiting.  Genitourinary: Negative.   Musculoskeletal:  Negative for arthralgias, joint swelling and neck pain.  Skin: Negative.  Negative for rash and wound.  Neurological:  Negative for dizziness, weakness, light-headedness, numbness and headaches.  Psychiatric/Behavioral: Negative.      Updated Vital Signs BP 126/77   Pulse 73   Temp 98.2 F (36.8 C)   Resp 14   Ht 6' 3 (1.905 m)   Wt 93 kg   SpO2 100%   BMI 25.62 kg/m   Physical Exam Vitals and nursing note reviewed.  Constitutional:      Appearance: He is well-developed.  HENT:     Head: Normocephalic and atraumatic.  Eyes:     Conjunctiva/sclera: Conjunctivae normal.  Cardiovascular:     Rate and Rhythm: Regular rhythm. Tachycardia present.     Pulses:          Dorsalis pedis pulses are 2+ on the right side and 2+ on the left side.     Heart sounds: Normal heart sounds.  Pulmonary:     Effort: Pulmonary effort is normal.     Breath sounds: Normal breath sounds. No wheezing.  Abdominal:  General: Bowel sounds are normal.     Palpations: Abdomen is soft.     Tenderness: There is no abdominal tenderness.  Musculoskeletal:        General: Swelling present. Normal range of motion.     Cervical back: Normal range of motion.     Right lower leg: Edema present.     Left lower leg: Edema present.     Comments: Mild tenderness to palpation of the left lower extremity.  He has significant pitting edema to his left lower thigh.  He also has  edema but less pronounced of his right calf and ankle.  Skin:    General: Skin is warm and dry.  Neurological:     Mental Status: He is alert.     (all labs ordered are listed, but only abnormal results are displayed) Labs  Reviewed  CBC WITH DIFFERENTIAL/PLATELET - Abnormal; Notable for the following components:      Result Value   RBC 3.85 (*)    Hemoglobin 10.5 (*)    HCT 33.3 (*)    Platelets 136 (*)    All other components within normal limits  BASIC METABOLIC PANEL WITH GFR - Abnormal; Notable for the following components:   Calcium 8.5 (*)    All other components within normal limits    EKG: EKG Interpretation Date/Time:  Wednesday September 01 2024 09:57:39 EDT Ventricular Rate:  100 PR Interval:  144 QRS Duration:  101 QT Interval:  345 QTC Calculation: 439 R Axis:   106  Text Interpretation: Sinus tachycardia RSR' in V1 or V2, probably normal variant Inferior infarct, age indeterminate Lateral leads are also involved Confirmed by Garrick Charleston [phone]) on 09/01/2024 10:12:36 AM  Radiology: No results found.   Procedures   Medications Ordered in the ED - No data to display                                  Medical Decision Making Patient with a known left DVT diagnosed 4 months ago, stopped taking his Eliquis  last month as he did not have significant improvement did not feel like medication is working.  But now with worsening swelling with standing extending above the knee on the left.  Also has swelling in his right calf which he had not experienced before.  No history to suggest PE.  History and exam is very concerning for worsening DVT left, we will also need to rule out new development of DVT in the right as well.  He had a bilateral Doppler ultrasound which did confirm significant clot burden in the left leg, none in the right however.  He was placed back on a Eliquis  starter pack, he also states he has plenty of the 5 mg tablets at home, he was advised to hold these until the starter pack is completed and then resume his current home supply.  Return precautions were outlined including chest pain, shortness of breath or any new or worsening symptoms.  He was advised to follow-up with his  oncologist, he has an appointment within the next 10 days.  Amount and/or Complexity of Data Reviewed Labs: ordered.    Details: Basic labs obtained the med and CBC, no significant findings, he does have a hemoglobin of 10.5 and thrombocytopenia pia with a platelet count of 136.  His hemoglobin is stable. Radiology: ordered.    Details: Doppler ultrasound results reviewed, clot burden in the left  leg which is not surprising, he does not have DVT on the right.  Risk Prescription drug management.        Final diagnoses:  Chronic deep vein thrombosis (DVT) of left lower extremity, unspecified vein (HCC)    ED Discharge Orders          Ordered    APIXABAN  (ELIQUIS ) VTE STARTER PACK (10MG  AND 5MG )       Note to Pharmacy: If starter pack unavailable, substitute with seventy-four 5 mg apixaban  tabs following the above SIG directions.   09/01/24 1224               Birdena Clarity, PA-C 09/03/24 1335    Garrick Charleston, MD 09/04/24 [phone]

## 2024-09-03 ENCOUNTER — Other Ambulatory Visit: Payer: Self-pay

## 2024-09-03 ENCOUNTER — Emergency Department (HOSPITAL_COMMUNITY)
Admission: EM | Admit: 2024-09-03 | Discharge: 2024-09-03 | Disposition: A | Discharge status: Home / Self Care | Payer: Self-pay | Attending: Emergency Medicine | Admitting: Emergency Medicine

## 2024-09-03 ENCOUNTER — Encounter (HOSPITAL_COMMUNITY): Payer: Self-pay

## 2024-09-03 ENCOUNTER — Emergency Department (HOSPITAL_COMMUNITY): Payer: Self-pay

## 2024-09-03 DIAGNOSIS — I824Z2 Acute embolism and thrombosis of unspecified deep veins of left distal lower extremity: Secondary | ICD-10-CM | POA: Insufficient documentation

## 2024-09-03 DIAGNOSIS — R1909 Other intra-abdominal and pelvic swelling, mass and lump: Secondary | ICD-10-CM | POA: Diagnosis present

## 2024-09-03 DIAGNOSIS — Z7901 Long term (current) use of anticoagulants: Secondary | ICD-10-CM | POA: Diagnosis not present

## 2024-09-03 LAB — CBC WITH DIFFERENTIAL/PLATELET
Abs Immature Granulocytes: 0.01 K/uL (ref 0.00–0.07)
Basophils Absolute: 0 K/uL (ref 0.0–0.1)
Basophils Relative: 1 %
Eosinophils Absolute: 0.3 K/uL (ref 0.0–0.5)
Eosinophils Relative: 5 %
HCT: 34.4 % — ABNORMAL LOW (ref 39.0–52.0)
Hemoglobin: 11 g/dL — ABNORMAL LOW (ref 13.0–17.0)
Immature Granulocytes: 0 %
Lymphocytes Relative: 21 %
Lymphs Abs: 1.3 K/uL (ref 0.7–4.0)
MCH: 27.7 pg (ref 26.0–34.0)
MCHC: 32 g/dL (ref 30.0–36.0)
MCV: 86.6 fL (ref 80.0–100.0)
Monocytes Absolute: 0.8 K/uL (ref 0.1–1.0)
Monocytes Relative: 13 %
Neutro Abs: 3.7 K/uL (ref 1.7–7.7)
Neutrophils Relative %: 60 %
Platelets: 155 K/uL (ref 150–400)
RBC: 3.97 MIL/uL — ABNORMAL LOW (ref 4.22–5.81)
RDW: 14.3 % (ref 11.5–15.5)
WBC: 6 K/uL (ref 4.0–10.5)
nRBC: 0 % (ref 0.0–0.2)

## 2024-09-03 LAB — COMPREHENSIVE METABOLIC PANEL WITH GFR
ALT: 7 U/L (ref 0–44)
AST: 12 U/L — ABNORMAL LOW (ref 15–41)
Albumin: 3 g/dL — ABNORMAL LOW (ref 3.5–5.0)
Alkaline Phosphatase: 65 U/L (ref 38–126)
Anion gap: 12 (ref 5–15)
BUN: 9 mg/dL (ref 6–20)
CO2: 24 mmol/L (ref 22–32)
Calcium: 8.9 mg/dL (ref 8.9–10.3)
Chloride: 103 mmol/L (ref 98–111)
Creatinine, Ser: 0.71 mg/dL (ref 0.61–1.24)
GFR, Estimated: >60 mL/min (ref >60)
Glucose, Bld: 96 mg/dL (ref 70–99)
Potassium: 3.5 mmol/L (ref 3.5–5.1)
Sodium: 139 mmol/L (ref 135–145)
Total Bilirubin: 0.9 mg/dL (ref 0.0–1.2)
Total Protein: 7 g/dL (ref 6.5–8.1)

## 2024-09-03 LAB — PROTIME-INR
INR: 1.4 — ABNORMAL HIGH (ref 0.8–1.2)
Prothrombin Time: 17.4 s — ABNORMAL HIGH (ref 11.4–15.2)

## 2024-09-03 MED ORDER — OXYCODONE-ACETAMINOPHEN 5-325 MG PO TABS: 1.0000 | ORAL_TABLET | ORAL | 0 refills | Status: AC | PRN

## 2024-09-03 MED ORDER — IOHEXOL 350 MG/ML SOLN
125.0000 mL | Freq: Once | INTRAVENOUS | Status: AC | PRN
  Administered 2024-09-03: 125 mL via INTRAVENOUS

## 2024-09-03 NOTE — ED Triage Notes (Signed)
 Pt had recent visit two days ago w/ blood clot in left leg. Pt states fluid has moved around and I want to make sure it's not any infection.

## 2024-09-03 NOTE — Consult Note (Signed)
      Reason for Consult:  DVT  MRN #:  [id number]  HPI: Patient was at [facility] but I reviewed the imaging and notes.  He presented 2 days ago with worsening left lower extremity pain.  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  He had discontinued Eliquis  himself a few weeks ago and began noticing worsening swelling when he presented to the ED 2 days ago.  A repeat duplex noted occlusive DVT of the left common femoral vein down through the tibial veins. Today he represented to the ED as the swelling had moved up into his hip and lower abdomen.  A CT venogram was obtained which demonstrates external iliac extension.  It is difficult to discern the common iliac and IVC due to artifact.  Data:   CT venogram independently reviewed. Extension of the DVT into the left external iliac, it appears that the common iliac and IVC are free from thrombus although difficult to discern due to artifact.    ASSESSMENT/PLAN: This is a 53 y.o. male left lower extremity DVT with iliac extension.  Thrombectomy was discussed by Dr. Magda at a previous visit but they elected for medical management.  After stopping anticoagulation he has significantly worse leg swelling with extension up to his hip and lower abdomen.  I offered thrombectomy as an outpatient sometime next week.  The [facility] emergency department spoke with him and he would like to proceed with thrombectomy. Our office will call him on Monday to set up outpatient venous thrombectomy of the LLE   Norman GORMAN Serve MD [facility] [phone] 09/03/2024  4:27 PM

## 2024-09-03 NOTE — Discharge Instructions (Addendum)
 Continue taking blood thinner.  Call the Vascular surgery office on Monday to schedule to be seen.   Providers Accepting New Patients in [place], [place]    [facility] [address]  [place], [place] [address]J [phone] Accepts most insurances  [facility] [address] [place], [place] [address] [phone] Accepts most insurances  [facility] [address] [place], [place] [address]  [phone] Must meet requirements  [facility] [address] [place], [place] [address] [phone] Accepts most insurances  [facility] [address]  [place], [place] [address] [phone] Accepts most insurances  [facility] [address]   [place], [place]   [phone] Accepts most insurances  [facility])  [phone] [address]  [place], [place] [address] [phone] Accepts most insurances     [facility] [address]  [place], [place] [address] [phone] Accepts most insurances  [facility] Department [address] [place], [place] [address] [phone] option 1 Accepts Medicaid and [facility] [address]  [place], [place] [address] [phone] Accepts most insurances  Benita Outhouse, MD [address] [place], [place] [address] [phone] Accepts most insurances  [facility] at [facility] [address]  [place], [place] [address] [phone] Accepts most insurances  [facility] [phone] [address] [place], [place] [address] [phone] Accepts most insurances  [place], [place] [address] [place], [place] [address] [phone]  Accepts most insurances

## 2024-09-04 NOTE — ED Provider Notes (Signed)
 [place] EMERGENCY DEPARTMENT AT [facility] Provider Note   CSN: [id number] Arrival date & time: 09/03/24  1121     Patient presents with: Leg Swelling   Carlos Sullivan is a 53 y.o. male.   Patient has a large amount of swelling to his left leg in his groin.  Patient has a known DVT to his left leg.  Patient reports that he is taking Eliquis .  Patient reports he is concerned because now he has swelling to the left side of his scrotum.  Patient has a past medical history of neuroendocrine carcinoma with metastatic disease.  Patient has previously been evaluated by vascular surgery and declined thrombectomy at that time but now has increased swelling and discomfort.  Patient denies having any fever or chills he has not had any nausea or vomiting.  Patient denies any chest pain he is not experiencing any shortness of breath.  Patient denies any abdominal pain  The history is provided by the patient. No language interpreter was used.       Prior to Admission medications   Medication Sig Start Date End Date Taking? Authorizing Provider  oxyCODONE -acetaminophen  (PERCOCET) 5-325 MG tablet Take 1 tablet by mouth every 4 (four) hours as needed for up to 5 days for severe pain (pain score 7-10). 09/03/24 09/08/24 Yes Xyler Terpening K, PA-C  acetaminophen  (TYLENOL ) 325 MG tablet Take 650 mg by mouth every 6 (six) hours as needed for moderate pain (pain score 4-6).    [provider]  apixaban  (ELIQUIS ) 5 MG TABS tablet Take 1 tablet (5 mg total) by mouth 2 (two) times daily. 06/09/24   Pasam, Chinita, MD  APIXABAN  (ELIQUIS ) VTE STARTER PACK (10MG  AND 5MG ) Take as directed on package: start with two-5mg  tablets twice daily for 7 days. On day 8, switch to one-5mg  tablet twice daily. 09/01/24   Idol, Julie, PA-C  Buprenorphine  HCl-Naloxone  HCl 8-2 MG FILM Place 1 Film under the tongue in the morning, at noon, and at bedtime. 04/24/24   [provider]    Allergies: Bee venom     Review of Systems  Genitourinary:  Positive for scrotal swelling.  All other systems reviewed and are negative.   Updated Vital Signs BP 110/68   Pulse 70   Temp 98.4 F (36.9 C)   Resp 16   Ht 6' 3 (1.905 m)   Wt 92.9 kg   SpO2 97%   BMI 25.60 kg/m   Physical Exam Vitals and nursing note reviewed.  Constitutional:      General: He is not in acute distress.    Appearance: He is well-developed.  HENT:     Head: Normocephalic and atraumatic.  Eyes:     Conjunctiva/sclera: Conjunctivae normal.  Cardiovascular:     Rate and Rhythm: Normal rate and regular rhythm.     Heart sounds: No murmur heard. Pulmonary:     Effort: Pulmonary effort is normal. No respiratory distress.     Breath sounds: Normal breath sounds.  Abdominal:     Palpations: Abdomen is soft.     Tenderness: There is no abdominal tenderness.  Genitourinary:    Comments: Swelling left scrotal area and left groin Musculoskeletal:        General: Swelling and tenderness present.     Cervical back: Normal range of motion and neck supple.     Comments: Large amount of swelling left leg, calf muscle is tight, neurovascular and neurosensory intact  Skin:    General:  Skin is warm and dry.     Capillary Refill: Capillary refill takes less than 2 seconds.  Neurological:     Mental Status: He is alert.  Psychiatric:        Mood and Affect: Mood normal.     (all labs ordered are listed, but only abnormal results are displayed) Labs Reviewed  CBC WITH DIFFERENTIAL/PLATELET - Abnormal; Notable for the following components:      Result Value   RBC 3.97 (*)    Hemoglobin 11.0 (*)    HCT 34.4 (*)    All other components within normal limits  COMPREHENSIVE METABOLIC PANEL WITH GFR - Abnormal; Notable for the following components:   Albumin 3.0 (*)    AST 12 (*)    All other components within normal limits  PROTIME-INR - Abnormal; Notable for the following components:   Prothrombin Time 17.4 (*)    INR 1.4  (*)    All other components within normal limits    EKG: None  Radiology: CT VENOGRAM ABD/PELVIS/LOWER EXT BILAT Result Date: 09/03/2024 CLINICAL DATA:  Sacral neuroendocrine tumor, left lower extremity DVT EXAM: CT VENOGRAM ABDOMEN AND PELVIS AND LOWER EXTREMITY BILATERAL TECHNIQUE: Venographic phase images of the abdomen, pelvis and lower extremities were obtained following the administration of intravenous contrast. Multiplanar reformats and maximum intensity projections were generated. RADIATION DOSE REDUCTION: This exam was performed according to the departmental dose-optimization program which includes automated exposure control, adjustment of the mA and/or kV according to patient size and/or use of iterative reconstruction technique. CONTRAST:  OMNIPAQUE  IOHEXOL  350 MG/ML SOLN COMPARISON:  09/01/2024, 06/02/2024 FINDINGS: Lower chest: No acute pleural or parenchymal lung disease. Hepatobiliary: Stable hypodense lesions within the inferior right lobe liver, measuring 1.4 and 0.9 cm respectively image 31/2, nonspecific but likely benign given lack of radiotracer uptake on recent PET scan. Hypodensity within the right lobe liver adjacent to the falciform ligament likely reflects transient hepatic attenuation difference, stable. No biliary duct dilation. Prior cholecystectomy. Pancreas: Unremarkable. Specifically, no abnormality to correspond to the hypermetabolic region between the head of the pancreas and second portion duodenum seen on recent PET scan. Please refer to that exam for full discussion. Spleen: Normal in size without focal abnormality. Adrenals/Urinary Tract: The adrenals are unremarkable. The kidneys enhance normally and symmetrically. Normal excretion of contrast on delayed imaging. The bladder is unremarkable. Stomach/Bowel: No bowel obstruction or ileus. The appendix is not well visualized. No bowel wall thickening or inflammatory change. Vascular/Lymphatic: The aorta is normal in  caliber. No pathologic adenopathy. Reproductive: Prostate is unremarkable. Large bilateral hydroceles are noted. Other: No free intraperitoneal fluid or free intraperitoneal gas. No abdominal wall hernia. Musculoskeletal: There is diffuse body wall edema, with subcutaneous edema extending into the left lower extremities left much greater than right. The lytic lesion centered within the sacrum seen on prior studies is again identified consistent with known history of neuroendocrine tumor. The known bony metastases at T11 and L5 as seen on prior PET scan are more difficult to appreciate by x-ray. Reconstructed images demonstrate no additional findings. CT venogram: Assessment of the venous structures is significantly limited due to timing of the contrast bolus. IVC: No evidence for thrombus or stenosis. Portal and mesenteric veins: No evidence for thrombus or stenosis. Bilateral iliac veins: Limited evaluation due to timing of the contrast bolus. The right common and external iliac veins appear patent. There is apparent thrombus throughout the left external iliac and common iliac veins. Right lower extremity: Limited evaluation due  to timing of contrast bolus. No evidence of deep venous thrombosis within the right lower extremity. Left lower extremity: Limited evaluation due to timing of the contrast bolus. Filling defect is evident within the left common femoral vein and saphenofemoral junction. Assessment of the left femoral and popliteal veins is limited, though there is asymmetric decreased attenuation suggesting occlusive thrombus as identified on preceding ultrasound exam. The calf vessels are suboptimally visualized. IMPRESSION: CT venogram: 1. Limited evaluation due to timing of contrast bolus. 2. Extensive left lower extremity deep venous thrombosis involving the left common iliac vein through the left popliteal vein as above. Suboptimal visualization of the left calf veins. 3. Stable IVC, right iliac veins,  and right lower extremity venous structures. Remaining structures: 1. Stable lytic lesion within the central sacrum consistent with known neuroendocrine tumor. The metastatic lesions in L5 and T11 vertebral body seen on prior PET scan are not well visualized on this exam. 2. Stable indeterminate hypodensities right lobe liver, favor benign etiology given lack of metabolic activity on recent PET scan. 3. Extensive subcutaneous edema, greatest throughout the left lower leg, consistent with sequela of anasarca and known left lower extremity deep venous thrombosis. 4. Large bilateral hydroceles. Electronically Signed   By: Ozell Daring M.D.   On: 09/03/2024 16:44     Procedures   Medications Ordered in the ED  iohexol  (OMNIPAQUE ) 350 MG/ML injection 125 mL (125 mLs Intravenous Contrast Given 09/03/24 1601)                                    Medical Decision Making Patient has a DVT of his left leg.  He is on Eliquis  he complains of increased swelling and pain  Amount and/or Complexity of Data Reviewed Labs: ordered. Decision-making details documented in ED Course.    Details: Labs ordered reviewed and interpreted patient has a normal white blood cell count Radiology: ordered and independent interpretation performed. Decision-making details documented in ED Course.    Details: CT angio abdomen shows extensive left lower extremity deep vein thrombus including the left common iliac through the popliteal.  Metastatic lesions L5 and T11.  Extensive subcutaneous edema Discussion of management or test interpretation with external provider(s): I discussed the patient with Dr. Pearline vascular surgeon.  He had advised patient would benefit from a thrombectomy.  He ask if patient would be interested I discussed this with the patient and patient would like to proceed with this.  Dr. Pearline advised he will have his office reach out to the patient to schedule him for procedure.  Risk Prescription drug  management. Risk Details: Patient is counseled on DVT he is advised to continue his medications.  Patient is given a prescription for pain medicine.  He is advised to call Dr. Drexel office on Monday to schedule follow-up.  He is advised to return if any problems.        Final diagnoses:  DVT, lower extremity, distal, acute, left [place])    ED Discharge Orders          Ordered    oxyCODONE -acetaminophen  (PERCOCET) 5-325 MG tablet  Every 4 hours PRN        09/03/24 1652            An After Visit Summary was printed and given to the patient.    Flint Sonny POUR, PA-C 09/04/24 1430    Suzette Pac, MD 09/04/24 [phone]

## 2024-09-08 ENCOUNTER — Other Ambulatory Visit: Payer: Self-pay

## 2024-09-08 DIAGNOSIS — I82412 Acute embolism and thrombosis of left femoral vein: Secondary | ICD-10-CM

## 2024-09-09 ENCOUNTER — Encounter (HOSPITAL_COMMUNITY)
Admission: RE | Disposition: A | Discharge status: Home / Self Care | Payer: Self-pay | Source: Home / Self Care | Attending: Vascular Surgery

## 2024-09-09 ENCOUNTER — Other Ambulatory Visit: Payer: Self-pay

## 2024-09-09 ENCOUNTER — Observation Stay (HOSPITAL_COMMUNITY)
Admission: RE | Admit: 2024-09-09 | Discharge: 2024-09-10 | Disposition: A | Discharge status: Home / Self Care | Payer: Self-pay | Attending: Vascular Surgery | Admitting: Vascular Surgery

## 2024-09-09 DIAGNOSIS — Z95820 Peripheral vascular angioplasty status with implants and grafts: Secondary | ICD-10-CM | POA: Diagnosis not present

## 2024-09-09 DIAGNOSIS — I878 Other specified disorders of veins: Secondary | ICD-10-CM

## 2024-09-09 DIAGNOSIS — I10 Essential (primary) hypertension: Secondary | ICD-10-CM | POA: Insufficient documentation

## 2024-09-09 DIAGNOSIS — I82409 Acute embolism and thrombosis of unspecified deep veins of unspecified lower extremity: Principal | ICD-10-CM | POA: Diagnosis present

## 2024-09-09 DIAGNOSIS — I82502 Chronic embolism and thrombosis of unspecified deep veins of left lower extremity: Principal | ICD-10-CM | POA: Insufficient documentation

## 2024-09-09 DIAGNOSIS — I82422 Acute embolism and thrombosis of left iliac vein: Secondary | ICD-10-CM

## 2024-09-09 DIAGNOSIS — I871 Compression of vein: Secondary | ICD-10-CM | POA: Diagnosis not present

## 2024-09-09 DIAGNOSIS — I82432 Acute embolism and thrombosis of left popliteal vein: Secondary | ICD-10-CM

## 2024-09-09 DIAGNOSIS — I82412 Acute embolism and thrombosis of left femoral vein: Secondary | ICD-10-CM

## 2024-09-09 HISTORY — PX: PERIPHERAL VASCULAR THROMBECTOMY: CATH118306

## 2024-09-09 HISTORY — PX: LOWER EXTREMITY INTERVENTION: CATH118252

## 2024-09-09 HISTORY — PX: LOWER EXTREMITY VENOGRAPHY: CATH118253

## 2024-09-09 LAB — POCT I-STAT, CHEM 8
BUN: 7 mg/dL (ref 6–20)
Calcium, Ion: 1.23 mmol/L (ref 1.15–1.40)
Chloride: 102 mmol/L (ref 98–111)
Creatinine, Ser: 0.8 mg/dL (ref 0.61–1.24)
Glucose, Bld: 77 mg/dL (ref 70–99)
HCT: 34 % — ABNORMAL LOW (ref 39.0–52.0)
Hemoglobin: 11.6 g/dL — ABNORMAL LOW (ref 13.0–17.0)
Potassium: 3.7 mmol/L (ref 3.5–5.1)
Sodium: 141 mmol/L (ref 135–145)
TCO2: 28 mmol/L (ref 22–32)

## 2024-09-09 LAB — APTT: aPTT: 57 s — ABNORMAL HIGH (ref 24–36)

## 2024-09-09 SURGERY — LOWER EXTREMITY VENOGRAPHY
Anesthesia: LOCAL

## 2024-09-09 MED ORDER — HEPARIN SODIUM (PORCINE) 1000 UNIT/ML IJ SOLN
INTRAMUSCULAR | Status: AC
  Filled 2024-09-09: qty 10

## 2024-09-09 MED ORDER — LABETALOL HCL 5 MG/ML IV SOLN: 10.0000 mg | INTRAVENOUS | Status: DC | PRN

## 2024-09-09 MED ORDER — HYDRALAZINE HCL 20 MG/ML IJ SOLN: 5.0000 mg | INTRAMUSCULAR | Status: DC | PRN

## 2024-09-09 MED ORDER — MIDAZOLAM HCL 2 MG/2ML IJ SOLN
INTRAMUSCULAR | Status: DC | PRN
  Administered 2024-09-09 (×5): 1 mg via INTRAVENOUS

## 2024-09-09 MED ORDER — LIDOCAINE HCL (PF) 1 % IJ SOLN
INTRAMUSCULAR | Status: DC | PRN
  Administered 2024-09-09: 10 mL via INTRADERMAL

## 2024-09-09 MED ORDER — ACETAMINOPHEN 325 MG PO TABS
650.0000 mg | ORAL_TABLET | ORAL | Status: DC | PRN
  Administered 2024-09-09: 650 mg via ORAL
  Filled 2024-09-09: qty 2

## 2024-09-09 MED ORDER — SODIUM CHLORIDE 0.9 % IV SOLN: 250.0000 mL | INTRAVENOUS | Status: DC | PRN

## 2024-09-09 MED ORDER — IODIXANOL 320 MG/ML IV SOLN
INTRAVENOUS | Status: DC | PRN
  Administered 2024-09-09: 70 mL

## 2024-09-09 MED ORDER — MIDAZOLAM HCL 2 MG/2ML IJ SOLN
INTRAMUSCULAR | Status: AC
  Filled 2024-09-09: qty 2

## 2024-09-09 MED ORDER — SODIUM CHLORIDE 0.9 % IV SOLN: INTRAVENOUS | Status: AC

## 2024-09-09 MED ORDER — HEPARIN (PORCINE) 25000 UT/250ML-% IV SOLN
2000.0000 [IU]/h | INTRAVENOUS | Status: DC
  Administered 2024-09-09: 1500 [IU]/h via INTRAVENOUS
  Filled 2024-09-09: qty 250

## 2024-09-09 MED ORDER — OXYCODONE HCL 5 MG PO TABS
5.0000 mg | ORAL_TABLET | ORAL | Status: DC | PRN
  Administered 2024-09-09 (×2): 10 mg via ORAL
  Filled 2024-09-09 (×2): qty 2

## 2024-09-09 MED ORDER — ONDANSETRON HCL 4 MG/2ML IJ SOLN: 4.0000 mg | Freq: Four times a day (QID) | INTRAMUSCULAR | Status: DC | PRN

## 2024-09-09 MED ORDER — HEPARIN SODIUM (PORCINE) 1000 UNIT/ML IJ SOLN
INTRAMUSCULAR | Status: DC | PRN
  Administered 2024-09-09: 3000 [IU] via INTRAVENOUS
  Administered 2024-09-09 (×2): 2000 [IU] via INTRAVENOUS
  Administered 2024-09-09 (×2): 5000 [IU] via INTRAVENOUS

## 2024-09-09 MED ORDER — FENTANYL CITRATE (PF) 100 MCG/2ML IJ SOLN
INTRAMUSCULAR | Status: AC
  Filled 2024-09-09: qty 2

## 2024-09-09 MED ORDER — DEXTROSE 50 % IV SOLN
INTRAVENOUS | Status: AC
  Filled 2024-09-09: qty 50

## 2024-09-09 MED ORDER — LIDOCAINE HCL (PF) 1 % IJ SOLN
INTRAMUSCULAR | Status: AC
  Filled 2024-09-09: qty 30

## 2024-09-09 MED ORDER — SODIUM CHLORIDE 0.9% FLUSH
3.0000 mL | Freq: Two times a day (BID) | INTRAVENOUS | Status: DC
Start: 2024-09-09 — End: 2024-09-10
  Administered 2024-09-10: 3 mL via INTRAVENOUS

## 2024-09-09 MED ORDER — SODIUM CHLORIDE 0.9% FLUSH: 3.0000 mL | INTRAVENOUS | Status: DC | PRN

## 2024-09-09 MED ORDER — SODIUM CHLORIDE 0.9 % IV SOLN: INTRAVENOUS | Status: DC

## 2024-09-09 MED ORDER — FENTANYL CITRATE (PF) 100 MCG/2ML IJ SOLN
INTRAMUSCULAR | Status: DC | PRN
  Administered 2024-09-09: 50 ug via INTRAVENOUS
  Administered 2024-09-09 (×4): 25 ug via INTRAVENOUS

## 2024-09-09 MED ORDER — HEPARIN (PORCINE) IN NACL 1000-0.9 UT/500ML-% IV SOLN
INTRAVENOUS | Status: DC | PRN
  Administered 2024-09-09 (×2): 500 mL

## 2024-09-09 SURGICAL SUPPLY — 19 items
BALLOON ATHLETIS 12X40X75 (BALLOONS) IMPLANT
BALLOON MUSTANG 6.0X20 75 (BALLOONS) IMPLANT
BALLOON VASC XXL 7F 14X4X120 (BALLOONS) IMPLANT
CATH ANGIO 5F BER2 100CM (CATHETERS) IMPLANT
CATH BEACON 5 .035 65 KMP TIP (CATHETERS) IMPLANT
CATH CLOT TRIEVER BOLD (CATHETERS) IMPLANT
CATH VISIONS PV .035 IVUS (CATHETERS) IMPLANT
COVER DOME SNAP 22 D (MISCELLANEOUS) IMPLANT
GLIDEWIRE ADV .035X260CM (WIRE) IMPLANT
GUIDEWIRE ANGLED .035X260CM (WIRE) IMPLANT
GUIDEWIRE NITREX 0.018X80X5 (WIRE) IMPLANT
KIT ENCORE 26 ADVANTAGE (KITS) IMPLANT
KIT MICROPUNCTURE NIT STIFF (SHEATH) IMPLANT
SHEATH CLOT RETRIEVER (SHEATH) IMPLANT
SHEATH PINNACLE 8F 10CM (SHEATH) IMPLANT
SHEATH PROBE COVER 6X72 (BAG) IMPLANT
STENT VENOUS ABRE 16X100 (Permanent Stent) IMPLANT
TRAY PV CATH (CUSTOM PROCEDURE TRAY) ×2 IMPLANT
WIRE TORQFLEX AUST .018X40CM (WIRE) IMPLANT

## 2024-09-09 NOTE — Progress Notes (Signed)
 PHARMACY - ANTICOAGULATION CONSULT NOTE  Pharmacy Consult for heparin  Indication: VTE Treatment  Allergies  Allergen Reactions   Bee Venom Anaphylaxis    Patient Measurements: Height: 6' 3 (190.5 cm) Weight: 90.7 kg (200 lb) IBW/kg (Calculated) : 84.5 HEPARIN  DW (KG): 90.7  Vital Signs: Temp: 97.6 F (36.4 C) (09/11 0843) Temp Source: Oral (09/11 0843) BP: 128/94 (09/11 1242) Pulse Rate: 0 (09/11 1242)  Labs: Recent Labs    09/09/24 0904  HGB 11.6*  HCT 34.0*  CREATININE 0.80    Estimated Creatinine Clearance: 130.6 mL/min (by C-G formula based on SCr of 0.8 mg/dL).   Medical History: Past Medical History:  Diagnosis Date   Cancer (HCC)    DVT of lower extremity (deep venous thrombosis) (HCC)    left leg   Hyperlipemia    Hypertension     Medications:  Scheduled:  Infusions:   sodium chloride       Assessment: 11 YOM admitted with left lower extremity DVT with iliac extension. Patient now s/p peripheral vascular thrombectomy. Patient was on Eliquis  prior ot hospitalization, last dose administered on 09/08/24. Pharmacy consulted for heparin  dosing.   09/09/24: Patient received 17,000 units of heparin  in the cath lab. Due to recent Eliquis  administration, will monitor heparin  level and aPTT until correlating.   Goal of Therapy:  Heparin  level 0.3-0.7 units/ml Monitor platelets by anticoagulation protocol: Yes   Plan:  Heparin  1500 units/hr - no bolus with heparin  received in cath lab Monitor aPTT in 6 hours Monitor heparin  level and aPTT daily until correlating Monitor CBC and s/sx of bleeding daily F/u when to resume PTA Eliquis   Morna Breach, PharmD PGY2 Cardiology Pharmacy Resident 09/09/2024 1:44 PM

## 2024-09-09 NOTE — Progress Notes (Signed)
 PHARMACY - ANTICOAGULATION CONSULT NOTE  Pharmacy Consult for heparin  Indication: VTE Treatment  Allergies  Allergen Reactions   Bee Venom Anaphylaxis    Patient Measurements: Height: 6' 3 (190.5 cm) Weight: 90.7 kg (200 lb) IBW/kg (Calculated) : 84.5 HEPARIN  DW (KG): 90.7  Vital Signs: Temp: 100.9 F (38.3 C) (09/11 2018) Temp Source: Oral (09/11 2018) BP: 111/68 (09/11 2018) Pulse Rate: 101 (09/11 2018)  Labs: Recent Labs    09/09/24 0904 09/09/24 2131  HGB 11.6*  --   HCT 34.0*  --   APTT  --  57*  CREATININE 0.80  --     Estimated Creatinine Clearance: 130.6 mL/min (by C-G formula based on SCr of 0.8 mg/dL).   Medical History: Past Medical History:  Diagnosis Date   Cancer [facility])    DVT of lower extremity (deep venous thrombosis) (HCC)    left leg   Hyperlipemia    Hypertension     Medications:  Scheduled:   dextrose        sodium chloride  flush  3 mL Intravenous Q12H   Infusions:   sodium chloride      heparin  1,500 Units/hr (09/09/24 1428)    Assessment: 53 YOM admitted with left lower extremity DVT with iliac extension. Patient now s/p peripheral vascular thrombectomy. Patient was on Eliquis  prior ot hospitalization, last dose administered on 09/08/24. Pharmacy consulted for heparin  dosing.   -aPTT= 57 on 1500 units/hr  Goal of Therapy:  Heparin  level 0.3-0.7 units/ml Monitor platelets by anticoagulation protocol: Yes   Plan:  -Increase heparin  to 1700 units/hr -Heparin  level in 6-8 hours and daily wth CBC daily  Prentice Poisson, PharmD Clinical Pharmacist **Pharmacist phone directory can now be found on [email or web address] ([email or web address]).  Listed under [facility].

## 2024-09-09 NOTE — H&P (Addendum)
 History and Physical Interval Note:  09/09/2024 9:53 AM  Carlos Sullivan  has presented today for surgery, with the diagnosis of dvct lle.  The various methods of treatment have been discussed with the patient and family. After consideration of risks, benefits and other options for treatment, the patient has consented to  Procedure(s): LOWER EXTREMITY VENOGRAPHY (N/A) PERIPHERAL VASCULAR THROMBECTOMY (N/A) as a surgical intervention.  The patient's history has been reviewed, patient examined, no change in status, stable for surgery.  I have reviewed the patient's chart and labs.  Questions were answered to the patient's satisfaction.    Plan percutaneous thrombectomy of left lower extremity DVT with iliac vein involvement.  Given chronic component given he was initially seen by Dr. Magda back in May 2025 may be unsuccessful.  Does feel his left leg is worse than baseline over the past week.  Carlos Sullivan           Reason for Consult:  DVT   MRN #:  [id number]   HPI: Patient was at [facility] but I reviewed the imaging and notes.  He presented 2 days ago with worsening left lower extremity pain.  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  He had discontinued Eliquis  himself a few weeks ago and began noticing worsening swelling when he presented to the ED 2 days ago.  A repeat duplex noted occlusive DVT of the left common femoral vein down through the tibial veins. Today he represented to the ED as the swelling had moved up into his hip and lower abdomen.  A CT venogram was obtained which demonstrates external iliac extension.  It is difficult to discern the common iliac and IVC due to artifact.   Data:   CT venogram independently reviewed. Extension of the DVT into the left external iliac, it appears that the common iliac and IVC are free from thrombus although difficult to discern due to artifact.       ASSESSMENT/PLAN: This is  a 53 y.o. male left lower extremity DVT with iliac extension.  Thrombectomy was discussed by Dr. Magda at a previous visit but they elected for medical management.  After stopping anticoagulation he has significantly worse leg swelling with extension up to his hip and lower abdomen.  I offered thrombectomy as an outpatient sometime next week.  The [facility] emergency department spoke with him and he would like to proceed with thrombectomy. Our office will call him on Monday to set up outpatient venous thrombectomy of the LLE     Norman GORMAN Serve MD [facility] [phone] 09/03/2024  4:27 PM

## 2024-09-09 NOTE — Plan of Care (Signed)
  Problem: Clinical Measurements: Goal: Cardiovascular complication will be avoided Outcome: Progressing   Problem: Activity: Goal: Risk for activity intolerance will decrease Outcome: Progressing   Problem: Nutrition: Goal: Adequate nutrition will be maintained Outcome: Progressing   Problem: Pain Managment: Goal: General experience of comfort will improve and/or be controlled Outcome: Progressing

## 2024-09-09 NOTE — Op Note (Signed)
 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  Ultrasound-guided access left popliteal vein 2.  Intravascular ultrasound (IVUS) of left popliteal vein, superficial femoral vein, common femoral vein, external iliac vein, common iliac vein and IVC 3.  Percutaneous mechanical thrombectomy of left popliteal vein, superficial femoral vein, common femoral vein, external iliac vein, and common iliac vein (Inari ClotTreiver) x 5 passes 4.  Left iliac venogram 5.  Left iliac vein angioplasty (12 and 14 mm Atlas) 6.  Stent of left external and common iliac vein (16 mm x 100 mm Abre post-dilated with a 12 mm and 14 mm Atlas) 7.  105 minutes of monitored moderate conscious sedation time  Indications: Patient is a 52 year old male seen with a extensive left leg DVT in May 2025 and elected for conservative management.  Returned last week to the ED with worsening left leg symptoms with concern for iliac vein involvement with more extensive DVT.  He now presents for left leg venous thrombectomy after risks benefits discussed.  Findings:   Ultrasound-guided access left popliteal vein with the patient in prone position.  Initial IVUS showed thrombus in the left popliteal vein, superficial femoral vein, common femoral vein, iliac vein with no thrombus in the IVC.  Ultimately we performed percutaneous mechanical thrombectomy with Inari ClotTreiver for a total of 5 passes retrieving large volume of acute thrombus with some underlying chronic thrombus.  IVUS suggested there was a high-grade stenosis at the left common iliac vein.  Venogram confirmed no flow past this segment with what look like a subtotal occlusion.  Ultimately I tried balloon angioplasty and ultimately had to stent this with a 16 mm x 100 mm  Abre.  Widely patent stent at completion with excellent flow into the IVC.   Procedure:  The patient was identified in the holding area and taken to room 8.  The patient was placed in the prone position.  The left popliteal fossa was then prepped and draped in standard sterile fashion.  Timeout was performed.  Patient received Versed  and Fentanyl  for conscious moderate sedation.  Vital signs were monitored including heart rate, respiratory rate, oxygenation, and blood pressure.  I was present for all moderate sedation.  I then used ultrasound to evaluate the left popliteal fossa after injecting lidocaine .  I initially tried to access the small saphenous vein but this was sclerotic and I could not get a wire to advance.  This was quite challenging due to the significant edema behind the leg ultimately I was able to access the popliteal vein behind the knee just above the knee joint placed a micro access needle and microwire and micro sheath and then got a Glidewire advantage up into the IVC through the left iliac artery.  I confirmed with hand-injection that I was in the venous system.  I then placed a short 8 French sheath in the left popliteal vein.  We gave 5000 units of IV heparin .  ACT's were checked.  Additional heparin  was given to get an ACT closer to 250.  I then performed IVUS of the left popliteal vein, superficial femoral vein, common femoral vein, iliac vein and IVC.  Pertinent findings are noted above.  Elected for percutaneous thrombectomy of the left leg venous sytem.  I then inserted the 13 [place] Inari sheath under fluoroscopic guidance in the  left popliteal vein.  I then used Inari ClotTreiver and made a total of 5 passes after deploying device in the IVC and pulling it through the left iliac vein into the femoral vein and popliteal vein to retrieve thrombus.  A total of 5 passes were made.  Final IVUS showed no significant residual thrombus but what looked like a high-grade stenosis in the left  common iliac vein with no outflow into the IVC.  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  I tried to initially treat this with balloon angioplasty using a 12 mm and 14 mm Atlas angioplasty balloon in the left iliac vein to nominal pressure for 2 minutes.  Imaging showed a small perforation here with no outflow through the iliac vein into the IVC.  Ultimately elected to stent this.  I then placed a 16 mm x 100 mm Abre stent using IVUS to determine the distance of disease.  The left iliac vein was stented in the left common vein into the left external iliac vein.  I then postdilated this with 12 and 14 mm Atlas balloon.  Final imaging showed a widely patent stent with excellent flow into the IVC.  The no evidence of ongoing perforation that sealed.  Wires and catheters were removed.  Pursestring was tied around the sheath behind the knee and this was removed.  Patient tolerated the procedure without immediate complications.  Will start IV heparin  and admit overnight for observation.  Plan: Heparin  now with pharmacy consult.  Will resume his Eliquis  in the morning.  Lonni DOROTHA Gaskins, MD [facility] [place] Office: [phone]

## 2024-09-10 ENCOUNTER — Encounter: Payer: Self-pay | Admitting: Oncology

## 2024-09-10 ENCOUNTER — Other Ambulatory Visit (HOSPITAL_COMMUNITY): Payer: Self-pay

## 2024-09-10 ENCOUNTER — Encounter (HOSPITAL_COMMUNITY): Payer: Self-pay | Admitting: Vascular Surgery

## 2024-09-10 DIAGNOSIS — I82502 Chronic embolism and thrombosis of unspecified deep veins of left lower extremity: Secondary | ICD-10-CM | POA: Diagnosis not present

## 2024-09-10 LAB — BASIC METABOLIC PANEL WITH GFR
Anion gap: 6 (ref 5–15)
BUN: 6 mg/dL (ref 6–20)
CO2: 27 mmol/L (ref 22–32)
Calcium: 8 mg/dL — ABNORMAL LOW (ref 8.9–10.3)
Chloride: 106 mmol/L (ref 98–111)
Creatinine, Ser: 0.9 mg/dL (ref 0.61–1.24)
GFR, Estimated: >60 mL/min (ref >60)
Glucose, Bld: 102 mg/dL — ABNORMAL HIGH (ref 70–99)
Potassium: 4.1 mmol/L (ref 3.5–5.1)
Sodium: 139 mmol/L (ref 135–145)

## 2024-09-10 LAB — CBC
HCT: 27.6 % — ABNORMAL LOW (ref 39.0–52.0)
Hemoglobin: 8.8 g/dL — ABNORMAL LOW (ref 13.0–17.0)
MCH: 26.9 pg (ref 26.0–34.0)
MCHC: 31.9 g/dL (ref 30.0–36.0)
MCV: 84.4 fL (ref 80.0–100.0)
Platelets: 167 K/uL (ref 150–400)
RBC: 3.27 MIL/uL — ABNORMAL LOW (ref 4.22–5.81)
RDW: 13.9 % (ref 11.5–15.5)
WBC: 5.6 K/uL (ref 4.0–10.5)
nRBC: 0 % (ref 0.0–0.2)

## 2024-09-10 LAB — APTT: aPTT: 72 s — ABNORMAL HIGH (ref 24–36)

## 2024-09-10 LAB — HEPARIN LEVEL (UNFRACTIONATED): Heparin Unfractionated: 0.2 [IU]/mL — ABNORMAL LOW (ref 0.30–0.70)

## 2024-09-10 MED ORDER — APIXABAN 5 MG PO TABS
ORAL_TABLET | ORAL | 2 refills | Status: DC
  Filled 2024-09-10: qty 74, 30d supply, fill #0

## 2024-09-10 MED ORDER — HEPARIN BOLUS VIA INFUSION
2000.0000 [IU] | Freq: Once | INTRAVENOUS | Status: AC
  Administered 2024-09-10: 2000 [IU] via INTRAVENOUS
  Filled 2024-09-10: qty 2000

## 2024-09-10 MED ORDER — RIVAROXABAN (XARELTO) VTE STARTER PACK (15 & 20 MG)
ORAL_TABLET | ORAL | 0 refills | Status: DC
Start: 2024-09-10 — End: 2024-11-05
  Filled 2024-09-10: qty 51, 30d supply, fill #0

## 2024-09-10 MED ORDER — APIXABAN 5 MG PO TABS: 5.0000 mg | ORAL_TABLET | Freq: Two times a day (BID) | ORAL | Status: DC

## 2024-09-10 MED ORDER — APIXABAN 5 MG PO TABS
10.0000 mg | ORAL_TABLET | Freq: Two times a day (BID) | ORAL | Status: DC
Start: 2024-09-10 — End: 2024-09-10
  Administered 2024-09-10: 10 mg via ORAL
  Filled 2024-09-10: qty 2

## 2024-09-10 NOTE — Progress Notes (Addendum)
  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  soft   CBC    Component Value Date/Time   WBC 5.6 09/10/2024 0317   RBC 3.27 (L) 09/10/2024 0317   HGB 8.8 (L) 09/10/2024 0317   HGB 11.7 (L) 06/09/2024 1219   HCT 27.6 (L) 09/10/2024 0317   PLT 167 09/10/2024 0317   PLT 155 06/09/2024 1219   MCV 84.4 09/10/2024 0317   MCH 26.9 09/10/2024 0317   MCHC 31.9 09/10/2024 0317   RDW 13.9 09/10/2024 0317   LYMPHSABS 1.3 09/03/2024 1329   MONOABS 0.8 09/03/2024 1329   EOSABS 0.3 09/03/2024 1329   BASOSABS 0.0 09/03/2024 1329    BMET    Component Value Date/Time   NA 139 09/10/2024 0317   K 4.1 09/10/2024 0317   CL 106 09/10/2024 0317   CO2 27 09/10/2024 0317   GLUCOSE 102 (H) 09/10/2024 0317   BUN 6 09/10/2024 0317   CREATININE 0.90 09/10/2024 0317   CREATININE 0.79 06/09/2024 1219   CALCIUM 8.0 (L) 09/10/2024 0317   GFRNONAA >60 09/10/2024 0317   GFRNONAA >60 06/09/2024 1219    INR    Component Value Date/Time   INR 1.4 (H) 09/03/2024 1329     Intake/Output Summary (Last 24 hours) at 09/10/2024 0641 Last data filed at 09/10/2024 0400 Gross per 24 hour  Intake 214.2 ml  Output --  Net 214.2 ml      Assessment/Plan:  53 y.o. male is s/p:  Angiogram LLE with Percutaneous mechanical thrombectomy of left popliteal vein, superficial femoral vein, common femoral vein, external iliac vein, and common iliac vein, left iliac venogram, left iliac vein angioplasty, stent  of left external and common iliac vein 09/09/2024 by Dr. Gretta 1 Day Post-Op   -pt subjectively feeling better.   -acute blood loss anemia-hgb 8.8 from 11.6 -renal function normal -will order thigh high compression for left leg.  -DVT prophylaxis:  heparin  gtt-restart Eliquis  today.    Lucie Apt, PA-C [facility] [phone] 09/10/2024 6:41 AM  I have seen and evaluated the patient. I agree with the PA note as documented above.  Status post left lower extremity percutaneous mechanical thrombectomy including left iliac vein stent for extensive DVT.  Leg looks better today.  Plan discharge on Eliquis .  Compression stocking ordered.  Follow-up arranged in 4 to 6 weeks with venous duplex.  Lonni DOROTHA Gretta, MD [facility] [place] Office: [phone]

## 2024-09-10 NOTE — Progress Notes (Signed)
 Mobility Specialist Progress Note:   09/10/24 1103  Mobility  Activity Ambulated independently  Level of Assistance Independent  Assistive Device None  Distance Ambulated (ft) 400 ft  Activity Response Tolerated well  Mobility Referral Yes  Mobility visit 1 Mobility  Mobility Specialist Start Time (ACUTE ONLY) 1055  Mobility Specialist Stop Time (ACUTE ONLY) 1103  Mobility Specialist Time Calculation (min) (ACUTE ONLY) 8 min   Pt received in bed, agreeable to mobility session. Tolerated well, c/o LE discomfort. Returned pt to room, VSS throughout. All needs met, eager for d/c.   Senita Corredor Mobility Specialist Please contact via SecureChat or  Rehab office at [phone]

## 2024-09-10 NOTE — Discharge Summary (Signed)
 Discharge Summary    Carlos Sullivan [birth year] 52 y.o. male  [id number]  Admission Date: 09/09/2024  Discharge Date: 09/10/2024  Physician: Gretta Lonni PARAS, MD  Admission Diagnosis: DVT (deep venous thrombosis) [facility]) [I82.409]   HPI:   This is a 53 y.o. male seen with a extensive left leg DVT in May 2025 and elected for conservative management.  Returned last week to the ED with worsening left leg symptoms with concern for iliac vein involvement with more extensive DVT.  He now presents for left leg venous thrombectomy after risks benefits discussed.   In May, he was found to have a left iliac and femoral vein DVT in addition to sacral and liver metastasis. He underwent surgery in 2018 at [facility], but has had his therapy interrupted because of incarceration.   At that time after discussion, medical management was preferred by pt at that time.    Hospital Course:  The patient was admitted to the hospital and taken to the [facility] lab on 09/09/2024 and underwent: 1.  Ultrasound-guided access left popliteal vein 2.  Intravascular ultrasound (IVUS) of left popliteal vein, superficial femoral vein, common femoral vein, external iliac vein, common iliac vein and IVC 3.  Percutaneous mechanical thrombectomy of left popliteal vein, superficial femoral vein, common femoral vein, external iliac vein, and common iliac vein (Inari ClotTreiver) x 5 passes 4.  Left iliac venogram 5.  Left iliac vein angioplasty (12 and 14 mm Atlas) 6.  Stent of left external and common iliac vein (16 mm x 100 mm Abre post-dilated with a 12 mm and 14 mm Atlas) 7.  105 minutes of monitored moderate conscious sedation time    Findings as follows: Ultrasound-guided access left popliteal vein with the patient in prone position.  Initial IVUS showed thrombus in the left popliteal vein, superficial femoral vein, common femoral vein, iliac vein with no thrombus in the IVC.  Ultimately we performed percutaneous  mechanical thrombectomy with Inari ClotTreiver for a total of 5 passes retrieving large volume of acute thrombus with some underlying chronic thrombus.  IVUS suggested there was a high-grade stenosis at the left common iliac vein.  Venogram confirmed no flow past this segment with what look like a subtotal occlusion.  Ultimately I tried balloon angioplasty and ultimately had to stent this with a 16 mm x 100 mm Abre.  Widely patent stent at completion with excellent flow into the IVC   The pt tolerated the procedure well and was transported to the PACU in good condition.   By POD 1, pt doing well and subjectively feeling better.  Leg swelling improved.  Thigh high compression stocking ordered and he was discharged home on Eliquis .     CBC    Component Value Date/Time   WBC 5.6 09/10/2024 0317   RBC 3.27 (L) 09/10/2024 0317   HGB 8.8 (L) 09/10/2024 0317   HGB 11.7 (L) 06/09/2024 1219   HCT 27.6 (L) 09/10/2024 0317   PLT 167 09/10/2024 0317   PLT 155 06/09/2024 1219   MCV 84.4 09/10/2024 0317   MCH 26.9 09/10/2024 0317   MCHC 31.9 09/10/2024 0317   RDW 13.9 09/10/2024 0317   LYMPHSABS 1.3 09/03/2024 1329   MONOABS 0.8 09/03/2024 1329   EOSABS 0.3 09/03/2024 1329   BASOSABS 0.0 09/03/2024 1329    BMET    Component Value Date/Time   NA 139 09/10/2024 0317   K 4.1 09/10/2024 0317   CL 106 09/10/2024 0317   CO2 27 09/10/2024 0317  GLUCOSE 102 (H) 09/10/2024 0317   BUN 6 09/10/2024 0317   CREATININE 0.90 09/10/2024 0317   CREATININE 0.79 06/09/2024 1219   CALCIUM 8.0 (L) 09/10/2024 0317   GFRNONAA >60 09/10/2024 0317   GFRNONAA >60 06/09/2024 1219      Discharge Instructions     Discharge patient   Complete by: As directed    Discharge once pt has compression and has walked.  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   apixaban  5 MG Tabs tablet Commonly known as: ELIQUIS  Take 1 tablet (5 mg total) by mouth 2 (two) times daily. What changed: You were already taking a medication with the same name, and this prescription was added. Make sure you understand how and when to take each.   Apixaban  Starter Pack (10mg  and 5mg ) Commonly known as: ELIQUIS  STARTER PACK Take as directed on package: start with two-5mg  tablets twice daily for 7 days. On day 8, switch to one-5mg  tablet twice daily. What changed: Another medication with the same name was added. Make sure you understand how and when to take each.   Buprenorphine  HCl-Naloxone  HCl 8-2 MG Film Place 1 Film under the tongue in the morning, at noon, and at bedtime.          Instructions:  [facility]  Discharge Instructions  Lower Extremity Angiogram; Angioplasty/Stenting  Please refer to the following instructions for your post-procedure care. Your surgeon or physician assistant will discuss any changes with you.  Activity  Avoid lifting more than 8 pounds (1 gallons of milk) for 72 hours (3 days) after your procedure. You may walk as much as you can tolerate. It's OK to drive after 72  hours.  Bathing/Showering  You may shower the day after your procedure. If you have a bandage, you may remove it at 24- 48 hours. Clean your incision site with mild soap and water. Pat the area dry with a clean towel.  Diet  Resume your pre-procedure diet. There are no special food restrictions following this procedure. All patients with peripheral vascular disease should follow a low fat/low cholesterol diet. In order to heal from your surgery, it is CRITICAL to get adequate nutrition. Your body requires vitamins, minerals, and protein. Vegetables are the best source of vitamins and minerals. Vegetables also provide the perfect balance of protein. Processed food has little nutritional value, so try to avoid this.  Medications  Resume taking all of your medications unless your doctor tells you  not to. If your incision is causing pain, you may take over-the-counter pain relievers such as acetaminophen  (Tylenol )  Follow Up  Follow up will be arranged at the time of your procedure. You may have an office visit scheduled or may be scheduled for surgery. Ask your surgeon if you have any questions.  Please call us  immediately for any of the following conditions: Severe or worsening pain your legs or feet at rest or with walking. Increased pain, redness, drainage at your groin puncture site. Fever of 101 degrees or higher. If you have any mild or slow bleeding from your puncture site: lie down, apply firm constant pressure over the area with a piece of gauze or a clean wash cloth for 30 minutes- no peeking!, call 911 right away if you are still bleeding after 30 minutes, or if the bleeding is heavy and unmanageable.  Reduce your risk factors of vascular disease:  Stop smoking. If you would like help call [facility] at 1-800-QUIT-NOW (318-718-2914) or [employer] at (930)529-6327. Manage your cholesterol Maintain a desired weight Control your diabetes Keep your blood pressure down  If you  have any questions, please call the office at [phone]  Prescriptions given: Xarelto  starter pack  Disposition: home  Patient's condition: is Good  Follow up: 1. [facility] in 4-5 weeks with LLE venous duplex and IVC/iliac duplex on Dr. Gretta clinic day.  Will need assistance with Xarelto  and will ask Lum Herald at [facility] for assistance.    Lucie Apt, PA-C [facility] [phone] 09/10/2024  9:30 AM

## 2024-09-10 NOTE — Plan of Care (Signed)

## 2024-09-10 NOTE — Discharge Instructions (Addendum)
 Information on my medicine - XARELTO  (rivaroxaban )  This medication education was reviewed with me or my healthcare representative as part of my discharge preparation.    WHY WAS XARELTO  PRESCRIBED FOR YOU? Xarelto  was prescribed to treat blood clots that may have been found in the veins of your legs (deep vein thrombosis) or in your lungs (pulmonary embolism) and to reduce the risk of them occurring again.  What do you need to know about Xarelto ? The starting dose is one 15 mg tablet taken TWICE daily with food for the FIRST 21 DAYS then on (enter date)  10/01/24  the dose is changed to one 20 mg tablet taken ONCE A DAY with your evening meal.  DO NOT stop taking Xarelto  without talking to the health care provider who prescribed the medication.  Refill your prescription for 20 mg tablets before you run out.  After discharge, you should have regular check-up appointments with your healthcare provider that is prescribing your Xarelto .  In the future your dose may need to be changed if your kidney function changes by a significant amount.  What do you do if you miss a dose? If you are taking Xarelto  TWICE DAILY and you miss a dose, take it as soon as you remember. You may take two 15 mg tablets (total 30 mg) at the same time then resume your regularly scheduled 15 mg twice daily the next day.  If you are taking Xarelto  ONCE DAILY and you miss a dose, take it as soon as you remember on the same day then continue your regularly scheduled once daily regimen the next day. Do not take two doses of Xarelto  at the same time.   Important Safety Information Xarelto  is a blood thinner medicine that can cause bleeding. You should call your healthcare provider right away if you experience any of the following: Bleeding from an injury or your nose that does not stop. Unusual colored urine (red or dark brown) or unusual colored stools (red or black). Unusual bruising for unknown reasons. A serious  fall or if you hit your head (even if there is no bleeding).  Some medicines may interact with Xarelto  and might increase your risk of bleeding while on Xarelto . To help avoid this, consult your healthcare provider or pharmacist prior to using any new prescription or non-prescription medications, including herbals, vitamins, non-steroidal anti-inflammatory drugs (NSAIDs) and supplements.  This website has more information on Xarelto : www.xarelto .com.

## 2024-09-10 NOTE — Progress Notes (Signed)
 PHARMACY - ANTICOAGULATION CONSULT NOTE  Pharmacy Consult for heparin  Indication: DVT  Labs: Recent Labs    09/09/24 0904 09/09/24 2131 09/10/24 0317  HGB 11.6*  --  8.8*  HCT 34.0*  --  27.6*  PLT  --   --  167  APTT  --  57* 72*  HEPARINUNFRC  --   --  0.20*  CREATININE 0.80  --  0.90   Assessment: 53yo male subtherapeutic on heparin  after rate change; no infusion issues or signs of bleeding per RN.  Goal of Therapy:  Heparin  level 0.3-0.7 units/ml   Plan:  2000 units heparin  bolus. Increase heparin  infusion by 3 units/kg/hr to 2000 units/hr. Check level in 6 hours.   Marvetta Dauphin, PharmD, BCPS 09/10/2024 5:19 AM

## 2024-09-10 NOTE — Progress Notes (Signed)
 PHARMACY - ANTICOAGULATION CONSULT NOTE  Pharmacy Consult for heparin >>apix Indication: VTE Treatment  Allergies  Allergen Reactions   Bee Venom Anaphylaxis    Patient Measurements: Height: 6' 3 (190.5 cm) Weight: 90.7 kg (200 lb) IBW/kg (Calculated) : 84.5 HEPARIN  DW (KG): 90.7  Vital Signs: Temp: 98.8 F (37.1 C) (09/12 1213) Temp Source: Oral (09/12 1213) BP: 112/72 (09/12 1213) Pulse Rate: 80 (09/12 1213)  Labs: Recent Labs    09/09/24 0904 09/09/24 2131 09/10/24 0317  HGB 11.6*  --  8.8*  HCT 34.0*  --  27.6*  PLT  --   --  167  APTT  --  57* 72*  HEPARINUNFRC  --   --  0.20*  CREATININE 0.80  --  0.90    Estimated Creatinine Clearance: 116.1 mL/min (by C-G formula based on SCr of 0.9 mg/dL).   Medical History: Past Medical History:  Diagnosis Date   Cancer [facility])    DVT of lower extremity (deep venous thrombosis) (HCC)    left leg   Hyperlipemia    Hypertension     Medications:  Scheduled:   apixaban   10 mg Oral BID   Followed by   NOREEN ON 09/17/2024] apixaban   5 mg Oral BID   sodium chloride  flush  3 mL Intravenous Q12H   Infusions:   sodium chloride  Stopped (09/10/24 1043)    Assessment: 53 YOM admitted with left lower extremity DVT with iliac extension. Patient now s/p peripheral vascular thrombectomy. Patient was on Eliquis  prior ot hospitalization but stopped himself several weeks ago per note.   S/p thrombectomy 9/11. Ok to transition to apixaban  today.   Addendum  Pt doesn't have any coverage for meds. He has used the 1 time 30d supply of apixaban , therefore, we can't use it again. We will change the apixaban  to Xarelto  instead so he can get the 30d supply for discharge. D/w Lum Herald at the [facility] clinic, she will set up an appt there so he can apply for Xarelto  patient assistance. FYI messaged [employer].   Goal of Therapy:  Monitor platelets by anticoagulation protocol: Yes   Plan:  Dc heparin  Xarelto  15mg  PO BID x21d then  20mg  PO qday F/u with [facility] for patient assistance  Sergio Batch, PharmD, BCIDP, AAHIVP, CPP Infectious Disease Pharmacist 09/10/2024 1:11 PM

## 2024-09-10 NOTE — Progress Notes (Signed)
 Co-ban, Ace wrap dressing  dressing removed from left leg. Measurement completed for thigh high compression socking( Ankle:7 in, calf:16.1 in , Thigh:29 in). Order has been placed. Plan of care continues.

## 2024-09-10 NOTE — Progress Notes (Signed)
 PHARMACY - ANTICOAGULATION CONSULT NOTE  Pharmacy Consult for heparin >>apix Indication: VTE Treatment  Allergies  Allergen Reactions   Bee Venom Anaphylaxis    Patient Measurements: Height: 6' 3 (190.5 cm) Weight: 90.7 kg (200 lb) IBW/kg (Calculated) : 84.5 HEPARIN  DW (KG): 90.7  Vital Signs: Temp: 99.2 F (37.3 C) (09/12 0406) Temp Source: Oral (09/12 0406) BP: 125/72 (09/12 0406) Pulse Rate: 84 (09/12 0406)  Labs: Recent Labs    09/09/24 0904 09/09/24 2131 09/10/24 0317  HGB 11.6*  --  8.8*  HCT 34.0*  --  27.6*  PLT  --   --  167  APTT  --  57* 72*  HEPARINUNFRC  --   --  0.20*  CREATININE 0.80  --  0.90    Estimated Creatinine Clearance: 116.1 mL/min (by C-G formula based on SCr of 0.9 mg/dL).   Medical History: Past Medical History:  Diagnosis Date   Cancer [facility])    DVT of lower extremity (deep venous thrombosis) (HCC)    left leg   Hyperlipemia    Hypertension     Medications:  Scheduled:   apixaban   10 mg Oral BID   Followed by   NOREEN ON 09/17/2024] apixaban   5 mg Oral BID   dextrose        sodium chloride  flush  3 mL Intravenous Q12H   Infusions:   sodium chloride       Assessment: 53 YOM admitted with left lower extremity DVT with iliac extension. Patient now s/p peripheral vascular thrombectomy. Patient was on Eliquis  prior ot hospitalization but stopped himself several weeks ago per note.   S/p thrombectomy 9/11. Ok to transition to apixaban  today.   Goal of Therapy:  Monitor platelets by anticoagulation protocol: Yes   Plan:  Dc heparin  Apixaban  10mg  PO BID x7 then 5mg  PO BID  Sergio Batch, PharmD, BCIDP, AAHIVP, CPP Infectious Disease Pharmacist 09/10/2024 7:42 AM

## 2024-09-11 ENCOUNTER — Encounter: Payer: Self-pay | Admitting: Oncology

## 2024-09-13 ENCOUNTER — Other Ambulatory Visit: Payer: Self-pay | Admitting: Oncology

## 2024-09-13 ENCOUNTER — Telehealth: Payer: Self-pay | Admitting: Oncology

## 2024-09-13 DIAGNOSIS — I824Y9 Acute embolism and thrombosis of unspecified deep veins of unspecified proximal lower extremity: Secondary | ICD-10-CM

## 2024-09-13 DIAGNOSIS — C7A8 Other malignant neuroendocrine tumors: Secondary | ICD-10-CM

## 2024-09-13 LAB — POCT ACTIVATED CLOTTING TIME
Activated Clotting Time: 164 s
Activated Clotting Time: 233 s

## 2024-09-13 NOTE — Telephone Encounter (Signed)
 Called to get the patient scheduled per 9/15 staff message. Unable to talk with the patient and was unable to leave a VM due to mailbox being full.

## 2024-09-15 ENCOUNTER — Telehealth: Payer: Self-pay | Admitting: Oncology

## 2024-09-15 NOTE — Telephone Encounter (Signed)
 Called to get the patient scheduled per 9/15 staff message. Unable to talk with the patient and was unable to leave a VM due to mailbox being full.

## 2024-09-16 ENCOUNTER — Encounter: Payer: Self-pay | Admitting: Oncology

## 2024-09-16 ENCOUNTER — Other Ambulatory Visit: Payer: Self-pay

## 2024-09-16 ENCOUNTER — Other Ambulatory Visit (HOSPITAL_COMMUNITY): Payer: Self-pay

## 2024-09-16 DIAGNOSIS — I82412 Acute embolism and thrombosis of left femoral vein: Secondary | ICD-10-CM

## 2024-09-16 NOTE — Progress Notes (Deleted)
 DVT Clinic Note  Name: Carlos Sullivan     MRN: [id number]     DOB: 07/31/1971     Sex: male  PCP: Patient, [name]  Today's Visit:    Referred to CPP by: Dr. Gretta Reason for referral: No chief complaint on file.  HISTORY OF PRESENT ILLNESS: Carlos Sullivan is a 53 y.o. 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 Patient was hospitalized in May 2025 and diagnosed with a DVT in the left iliac and femoral veins. Thrombectomy was discussed, but patient was improving with anticoagulation and he preferred to avoid thrombectomy at the time. He was discharged on Eliquis . 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 Patient had intermittent follow up with oncology since then as he was under [facility] custody and living in a half way house. Presented to the ED on 09/01/24 and reported he had stopped Eliquis  as he did not notice any improvement in his swelling, however swelling had worsened since stopping Eliquis . Repeat ultrasound showed occlusive DVT of the left common femoral to the tibial veins. He was instructed to restart Eliquis . Patient presented to ED 2 days later on 09/03/24 for leg swelling and pain. CT venogram showed extension of DVT to the external iliac and thrombectomy was completed on 09/09/24 with Dr. Gretta. He had already used the Eliquis  1 month free card so he was discharged on Xarelto  which was filled using the 1 month free card. Referred to DVT Clinic for assistance with medication access.  Co-pay card - can do Eliquis  or Xarelto  Has [facility] PA follow up on 10/19/24 Onc has been trying to reach him for scheduling          Rx Insurance Coverage: Commercial Rx Affordability: Eliquis /Xarelto  are ~$560 per month with his insurance due to an unmet deductible. Rx  Assistance Provided: {Rx Assistance Provided:[id number]} Preferred Pharmacy: ***  Past Medical History:  Diagnosis Date   Cancer (HCC)    DVT of lower extremity (deep venous thrombosis) (HCC)    left leg   Hyperlipemia    Hypertension     Past Surgical History:  Procedure Laterality Date   CHOLECYSTECTOMY     HERNIA REPAIR     LOWER EXTREMITY INTERVENTION  09/09/2024   Procedure: LOWER EXTREMITY INTERVENTION;  Surgeon: Gretta Lonni PARAS, MD;  Location: [facility];  Service: Cardiovascular;;   LOWER EXTREMITY VENOGRAPHY N/A 09/09/2024   Procedure: LOWER EXTREMITY VENOGRAPHY;  Surgeon: Gretta Lonni PARAS, MD;  Location: [facility];  Service: Cardiovascular;  Laterality: N/A;   PERIPHERAL VASCULAR THROMBECTOMY N/A 09/09/2024   Procedure: PERIPHERAL VASCULAR THROMBECTOMY;  Surgeon: Gretta Lonni PARAS, MD;  Location: [facility];  Service: Cardiovascular;  Laterality: N/A;   TONSILLECTOMY      Social History   Socioeconomic History   Marital status: Divorced    Spouse name: Not on file   Number of children: Not on file   Years of education: Not on file   Highest education level: Not on file  Occupational History   Not on file  Tobacco Use   Smoking status: Every Day    Current packs/day: 0.50    Types: Cigarettes   Smokeless tobacco: Not on file  Vaping Use   Vaping status: Never Used  Substance and Sexual Activity  Alcohol use: No   Drug use: No   Sexual activity: Not on file  Other Topics Concern   Not on file  Social History Narrative   Not on file   Social Drivers of Health   Financial Resource Strain: Not on file  Food Insecurity: Patient Declined (09/10/2024)   Hunger Vital Sign    Worried About Running Out of Food in the Last Year: Patient declined    Ran Out of Food in the Last Year: Patient declined  Transportation Needs: Patient Declined (09/10/2024)   PRAPARE - Administrator, Civil Service (Medical): Patient declined     Lack of Transportation (Non-Medical): Patient declined  Physical Activity: Not on file  Stress: Not on file  Social Connections: Not on file  Intimate Partner Violence: Patient Declined (09/10/2024)   Humiliation, Afraid, Rape, and Kick questionnaire    Fear of Current or Ex-Partner: Patient declined    Emotionally Abused: Patient declined    Physically Abused: Patient declined    Sexually Abused: Patient declined    No family history on file.  Allergies as of 09/17/2024 - Review Complete 09/09/2024  Allergen Reaction Noted   Bee venom Anaphylaxis 05/12/2024    Current Outpatient Medications on File Prior to Visit  Medication Sig Dispense Refill   acetaminophen  (TYLENOL ) 325 MG tablet Take 650 mg by mouth every 6 (six) hours as needed for moderate pain (pain score 4-6).     Buprenorphine  HCl-Naloxone  HCl 8-2 MG FILM Place 1 Film under the tongue in the morning, at noon, and at bedtime.     RIVAROXABAN  (XARELTO ) VTE STARTER PACK (15 & 20 MG) Follow package directions: Take one 15mg  tablet by mouth twice a day. On day 22, switch to one 20mg  tablet once a day. Take with food. 51 each 0   No current facility-administered medications on file prior to visit.   REVIEW OF SYSTEMS:  ROS PHYSICAL EXAMINATION:  There were no vitals filed for this visit.  There is no height or weight on file to calculate BMI.  Physical Exam Villalta Score for Post-Thrombotic Syndrome:    LABS:  CBC     Component Value Date/Time   WBC 5.6 09/10/2024 0317   RBC 3.27 (L) 09/10/2024 0317   HGB 8.8 (L) 09/10/2024 0317   HGB 11.7 (L) 06/09/2024 1219   HCT 27.6 (L) 09/10/2024 0317   PLT 167 09/10/2024 0317   PLT 155 06/09/2024 1219   MCV 84.4 09/10/2024 0317   MCH 26.9 09/10/2024 0317   MCHC 31.9 09/10/2024 0317   RDW 13.9 09/10/2024 0317   LYMPHSABS 1.3 09/03/2024 1329   MONOABS 0.8 09/03/2024 1329   EOSABS 0.3 09/03/2024 1329   BASOSABS 0.0 09/03/2024 1329    Hepatic Function      Component  Value Date/Time   PROT 7.0 09/03/2024 1329   ALBUMIN 3.0 (L) 09/03/2024 1329   AST 12 (L) 09/03/2024 1329   AST 14 (L) 06/09/2024 1219   ALT 7 09/03/2024 1329   ALT 9 06/09/2024 1219   ALKPHOS 65 09/03/2024 1329   BILITOT 0.9 09/03/2024 1329   BILITOT 0.5 06/09/2024 1219    Renal Function   Lab Results  Component Value Date   CREATININE 0.90 09/10/2024   CREATININE 0.80 09/09/2024   CREATININE 0.71 09/03/2024    Estimated Creatinine Clearance: 116.1 mL/min (by C-G formula based on SCr of 0.9 mg/dL).   [facility] Vascular Lab Studies:  09/03/24 CT VENOGRAM ABD/PELVIS/LOWER EXT BILAT: IMPRESSION: CT venogram:  1. Limited evaluation due to timing of contrast bolus. 2. Extensive left lower extremity deep venous thrombosis involving the left common iliac vein through the left popliteal vein as above. Suboptimal visualization of the left calf veins. 3. Stable IVC, right iliac veins, and right lower extremity venous structures.   Remaining structures: 1. Stable lytic lesion within the central sacrum consistent with known neuroendocrine tumor. The metastatic lesions in L5 and T11 vertebral body seen on prior PET scan are not well visualized on this exam. 2. Stable indeterminate hypodensities right lobe liver, favor benign etiology given lack of metabolic activity on recent PET scan. 3. Extensive subcutaneous edema, greatest throughout the left lower leg, consistent with sequela of anasarca and known left lower extremity deep venous thrombosis. 4. Large bilateral hydroceles.  09/01/24 US  Venous Img Lower Bilateral: IMPRESSION: Occlusive deep vein thrombosis throughout the left lower extremity.   No evidence of right lower extremity DVT.   05/12/2024 VAS US  LOWER EXTREMITY VENOUS LEFT (DVT): Summary:  RIGHT:  - No evidence of common femoral vein obstruction.    LEFT:  - There is no evidence of deep vein thrombosis in the lower extremity.  However, portions of this examination were  limited- see technologist  comments above.    - No cystic structure found in the popliteal fossa.   ASSESSMENT:    PLAN: {DVT Clinic Eojw:71604}  Patient is discharged from the DVT Clinic:  {DVT CLINIC DISCHARGE PWQN:71609} Follow up: ***  Izetta Henry, PharmD [facility] Clinical Pharmacist

## 2024-09-17 ENCOUNTER — Ambulatory Visit: Attending: Pharmacist | Admitting: Pharmacist

## 2024-09-20 ENCOUNTER — Telehealth: Payer: Self-pay | Admitting: Oncology

## 2024-09-20 NOTE — Telephone Encounter (Signed)
 Called to get the patient scheduled per staff message. Unable to make contact with the patient or leave a VM due to mailbox being full.

## 2024-09-20 NOTE — Progress Notes (Deleted)
 DVT Clinic Note  Name: Carlos Sullivan     MRN: [id number]     DOB: 01-Sep-1971     Sex: male  PCP: Patient, [name]  Today's Visit:    Referred to CPP by: Dr. Gretta Reason for referral: No chief complaint on file.  HISTORY OF PRESENT ILLNESS: Carlos Sullivan is a 53 y.o. 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 Patient was hospitalized in May 2025 and diagnosed with a DVT in the left iliac and femoral veins. Thrombectomy was discussed, but patient was improving with anticoagulation and he preferred to avoid thrombectomy at the time. He was discharged on Eliquis . 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 Patient had intermittent follow up with oncology since then as he was under [facility] custody and living in a half way house. Presented to the ED on 09/01/24 and reported he had stopped Eliquis  as he did not notice any improvement in his swelling, however swelling had worsened since stopping Eliquis . Repeat ultrasound showed occlusive DVT of the left common femoral to the tibial veins. He was instructed to restart Eliquis  and discharged. Patient presented to ED 2 days later on 09/03/24 for leg swelling and pain. CT venogram showed extension of DVT to the external iliac and thrombectomy was completed on 09/09/24 with Dr. Gretta. He had already used the Eliquis  1 month free card so he was discharged on Xarelto  which was filled using the 1 month free card. Referred to DVT Clinic for assistance with medication access.  Co-pay card - can do Eliquis  or Xarelto  Has [facility] PA follow up on 10/19/24 Onc has been trying to reach him for scheduling          Rx Insurance Coverage: Commercial Rx Affordability: Eliquis /Xarelto  are ~$560 per month with his insurance due to an unmet  deductible. Rx Assistance Provided: {Rx Assistance Provided:[id number]} Preferred Pharmacy: ***  Past Medical History:  Diagnosis Date   Cancer (HCC)    DVT of lower extremity (deep venous thrombosis) (HCC)    left leg   Hyperlipemia    Hypertension     Past Surgical History:  Procedure Laterality Date   CHOLECYSTECTOMY     HERNIA REPAIR     LOWER EXTREMITY INTERVENTION  09/09/2024   Procedure: LOWER EXTREMITY INTERVENTION;  Surgeon: Gretta Lonni PARAS, MD;  Location: [facility];  Service: Cardiovascular;;   LOWER EXTREMITY VENOGRAPHY N/A 09/09/2024   Procedure: LOWER EXTREMITY VENOGRAPHY;  Surgeon: Gretta Lonni PARAS, MD;  Location: [facility];  Service: Cardiovascular;  Laterality: N/A;   PERIPHERAL VASCULAR THROMBECTOMY N/A 09/09/2024   Procedure: PERIPHERAL VASCULAR THROMBECTOMY;  Surgeon: Gretta Lonni PARAS, MD;  Location: [facility];  Service: Cardiovascular;  Laterality: N/A;   TONSILLECTOMY      Social History   Socioeconomic History   Marital status: Divorced    Spouse name: Not on file   Number of children: Not on file   Years of education: Not on file   Highest education level: Not on file  Occupational History   Not on file  Tobacco Use   Smoking status: Every Day    Current packs/day: 0.50    Types: Cigarettes   Smokeless tobacco: Not on file  Vaping Use   Vaping status: Never Used  Substance and  Sexual Activity   Alcohol use: No   Drug use: No   Sexual activity: Not on file  Other Topics Concern   Not on file  Social History Narrative   Not on file   Social Drivers of Health   Financial Resource Strain: Not on file  Food Insecurity: Patient Declined (09/10/2024)   Hunger Vital Sign    Worried About Running Out of Food in the Last Year: Patient declined    Ran Out of Food in the Last Year: Patient declined  Transportation Needs: Patient Declined (09/10/2024)   PRAPARE - Administrator, Civil Service (Medical):  Patient declined    Lack of Transportation (Non-Medical): Patient declined  Physical Activity: Not on file  Stress: Not on file  Social Connections: Not on file  Intimate Partner Violence: Patient Declined (09/10/2024)   Humiliation, Afraid, Rape, and Kick questionnaire    Fear of Current or Ex-Partner: Patient declined    Emotionally Abused: Patient declined    Physically Abused: Patient declined    Sexually Abused: Patient declined    No family history on file.  Allergies as of 09/21/2024 - Review Complete 09/09/2024  Allergen Reaction Noted   Bee venom Anaphylaxis 05/12/2024    Current Outpatient Medications on File Prior to Visit  Medication Sig Dispense Refill   acetaminophen  (TYLENOL ) 325 MG tablet Take 650 mg by mouth every 6 (six) hours as needed for moderate pain (pain score 4-6).     Buprenorphine  HCl-Naloxone  HCl 8-2 MG FILM Place 1 Film under the tongue in the morning, at noon, and at bedtime.     RIVAROXABAN  (XARELTO ) VTE STARTER PACK (15 & 20 MG) Follow package directions: Take one 15mg  tablet by mouth twice a day. On day 22, switch to one 20mg  tablet once a day. Take with food. 51 each 0   No current facility-administered medications on file prior to visit.   REVIEW OF SYSTEMS:  ROS PHYSICAL EXAMINATION:  There were no vitals filed for this visit.  There is no height or weight on file to calculate BMI.  Physical Exam Villalta Score for Post-Thrombotic Syndrome:    LABS:  CBC     Component Value Date/Time   WBC 5.6 09/10/2024 0317   RBC 3.27 (L) 09/10/2024 0317   HGB 8.8 (L) 09/10/2024 0317   HGB 11.7 (L) 06/09/2024 1219   HCT 27.6 (L) 09/10/2024 0317   PLT 167 09/10/2024 0317   PLT 155 06/09/2024 1219   MCV 84.4 09/10/2024 0317   MCH 26.9 09/10/2024 0317   MCHC 31.9 09/10/2024 0317   RDW 13.9 09/10/2024 0317   LYMPHSABS 1.3 09/03/2024 1329   MONOABS 0.8 09/03/2024 1329   EOSABS 0.3 09/03/2024 1329   BASOSABS 0.0 09/03/2024 1329    Hepatic Function       Component Value Date/Time   PROT 7.0 09/03/2024 1329   ALBUMIN 3.0 (L) 09/03/2024 1329   AST 12 (L) 09/03/2024 1329   AST 14 (L) 06/09/2024 1219   ALT 7 09/03/2024 1329   ALT 9 06/09/2024 1219   ALKPHOS 65 09/03/2024 1329   BILITOT 0.9 09/03/2024 1329   BILITOT 0.5 06/09/2024 1219    Renal Function   Lab Results  Component Value Date   CREATININE 0.90 09/10/2024   CREATININE 0.80 09/09/2024   CREATININE 0.71 09/03/2024    Estimated Creatinine Clearance: 116.1 mL/min (by C-G formula based on SCr of 0.9 mg/dL).   [facility] Vascular Lab Studies:  09/03/24 CT VENOGRAM ABD/PELVIS/LOWER EXT  BILAT: IMPRESSION: CT venogram: 1. Limited evaluation due to timing of contrast bolus. 2. Extensive left lower extremity deep venous thrombosis involving the left common iliac vein through the left popliteal vein as above. Suboptimal visualization of the left calf veins. 3. Stable IVC, right iliac veins, and right lower extremity venous structures.   Remaining structures: 1. Stable lytic lesion within the central sacrum consistent with known neuroendocrine tumor. The metastatic lesions in L5 and T11 vertebral body seen on prior PET scan are not well visualized on this exam. 2. Stable indeterminate hypodensities right lobe liver, favor benign etiology given lack of metabolic activity on recent PET scan. 3. Extensive subcutaneous edema, greatest throughout the left lower leg, consistent with sequela of anasarca and known left lower extremity deep venous thrombosis. 4. Large bilateral hydroceles.  09/01/24 US  Venous Img Lower Bilateral: IMPRESSION: Occlusive deep vein thrombosis throughout the left lower extremity.   No evidence of right lower extremity DVT.   05/12/2024 VAS US  LOWER EXTREMITY VENOUS LEFT (DVT): Summary:  RIGHT:  - No evidence of common femoral vein obstruction.    LEFT:  - There is no evidence of deep vein thrombosis in the lower extremity.  However, portions of this  examination were limited- see technologist  comments above.    - No cystic structure found in the popliteal fossa.   ASSESSMENT:    PLAN: {DVT Clinic Eojw:71604}  Patient is discharged from the DVT Clinic:  {DVT CLINIC DISCHARGE PWQN:71609} Follow up: ***  Izetta Henry, PharmD [facility] Clinical Pharmacist

## 2024-09-21 ENCOUNTER — Encounter: Admitting: Student-PharmD

## 2024-09-21 ENCOUNTER — Ambulatory Visit: Attending: Pharmacist | Admitting: Pharmacist

## 2024-09-27 ENCOUNTER — Encounter (HOSPITAL_COMMUNITY): Payer: Self-pay

## 2024-09-27 ENCOUNTER — Other Ambulatory Visit: Payer: Self-pay

## 2024-09-27 ENCOUNTER — Encounter: Payer: Self-pay | Admitting: Oncology

## 2024-09-27 ENCOUNTER — Emergency Department (HOSPITAL_COMMUNITY)
Admission: EM | Admit: 2024-09-27 | Discharge: 2024-09-28 | Disposition: A | Discharge status: Home / Self Care | Attending: Emergency Medicine | Admitting: Emergency Medicine

## 2024-09-27 DIAGNOSIS — I10 Essential (primary) hypertension: Secondary | ICD-10-CM | POA: Diagnosis not present

## 2024-09-27 DIAGNOSIS — M5430 Sciatica, unspecified side: Secondary | ICD-10-CM | POA: Diagnosis present

## 2024-09-27 DIAGNOSIS — Z7901 Long term (current) use of anticoagulants: Secondary | ICD-10-CM | POA: Insufficient documentation

## 2024-09-27 DIAGNOSIS — Z859 Personal history of malignant neoplasm, unspecified: Secondary | ICD-10-CM | POA: Diagnosis not present
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
## 2024-09-28 MED ORDER — NAPROXEN 500 MG PO TABS: 500.0000 mg | ORAL_TABLET | Freq: Two times a day (BID) | ORAL | 0 refills | Status: DC

## 2024-09-28 MED ORDER — KETOROLAC TROMETHAMINE 30 MG/ML IJ SOLN
30.0000 mg | Freq: Once | INTRAMUSCULAR | Status: AC
  Administered 2024-09-28: 30 mg via INTRAMUSCULAR
  Filled 2024-09-28: qty 1

## 2024-09-28 MED ORDER — TRAZODONE HCL 50 MG PO TABS: 50.0000 mg | ORAL_TABLET | Freq: Every evening | ORAL | 0 refills | Status: DC | PRN

## 2024-09-28 NOTE — Discharge Instructions (Signed)
 You were seen today for concerns for sciatica and sleep deprivation.  This is likely causing you to be somewhat delirious and out of touch with reality.  You seem to have good insight into this.  Add naproxen to your pain regimen.  Trial trazodone to sleep.  Follow-up with your primary doctor.

## 2024-09-28 NOTE — ED Notes (Signed)
 Pt complaining of siatic nerve pain that is going down both legs.
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
## 2024-09-29 ENCOUNTER — Telehealth: Payer: Self-pay | Admitting: Oncology

## 2024-09-29 NOTE — Telephone Encounter (Signed)
 Called to get the patient scheduled per staff message from Dr.Pasm. Unable to leave a VM due to mailbox being full.

## 2024-10-19 ENCOUNTER — Encounter (HOSPITAL_COMMUNITY): Payer: Self-pay

## 2024-10-19 ENCOUNTER — Ambulatory Visit (HOSPITAL_COMMUNITY): Payer: Self-pay

## 2024-11-01 ENCOUNTER — Inpatient Hospital Stay (HOSPITAL_COMMUNITY)
Admission: EM | Admit: 2024-11-01 | Discharge: 2024-11-05 | DRG: 843 | Disposition: A | Discharge status: Home / Self Care | Attending: Internal Medicine | Admitting: Internal Medicine

## 2024-11-01 ENCOUNTER — Emergency Department (HOSPITAL_COMMUNITY)

## 2024-11-01 ENCOUNTER — Other Ambulatory Visit: Payer: Self-pay

## 2024-11-01 ENCOUNTER — Encounter (HOSPITAL_COMMUNITY): Payer: Self-pay

## 2024-11-01 DIAGNOSIS — R338 Other retention of urine: Secondary | ICD-10-CM | POA: Diagnosis not present

## 2024-11-01 DIAGNOSIS — R159 Full incontinence of feces: Secondary | ICD-10-CM | POA: Diagnosis present

## 2024-11-01 DIAGNOSIS — R262 Difficulty in walking, not elsewhere classified: Secondary | ICD-10-CM | POA: Diagnosis present

## 2024-11-01 DIAGNOSIS — M533 Sacrococcygeal disorders, not elsewhere classified: Secondary | ICD-10-CM | POA: Diagnosis not present

## 2024-11-01 DIAGNOSIS — C7B8 Other secondary neuroendocrine tumors: Secondary | ICD-10-CM | POA: Diagnosis present

## 2024-11-01 DIAGNOSIS — M5442 Lumbago with sciatica, left side: Secondary | ICD-10-CM | POA: Diagnosis present

## 2024-11-01 DIAGNOSIS — Z5982 Transportation insecurity: Secondary | ICD-10-CM | POA: Diagnosis not present

## 2024-11-01 DIAGNOSIS — R200 Anesthesia of skin: Secondary | ICD-10-CM

## 2024-11-01 DIAGNOSIS — R339 Retention of urine, unspecified: Secondary | ICD-10-CM | POA: Diagnosis present

## 2024-11-01 DIAGNOSIS — R0781 Pleurodynia: Secondary | ICD-10-CM | POA: Diagnosis present

## 2024-11-01 DIAGNOSIS — Z86718 Personal history of other venous thrombosis and embolism: Secondary | ICD-10-CM

## 2024-11-01 DIAGNOSIS — E785 Hyperlipidemia, unspecified: Secondary | ICD-10-CM | POA: Diagnosis present

## 2024-11-01 DIAGNOSIS — Z91030 Bee allergy status: Secondary | ICD-10-CM

## 2024-11-01 DIAGNOSIS — Z6825 Body mass index (BMI) 25.0-25.9, adult: Secondary | ICD-10-CM | POA: Diagnosis not present

## 2024-11-01 DIAGNOSIS — I10 Essential (primary) hypertension: Secondary | ICD-10-CM | POA: Diagnosis present

## 2024-11-01 DIAGNOSIS — F17210 Nicotine dependence, cigarettes, uncomplicated: Secondary | ICD-10-CM | POA: Diagnosis present

## 2024-11-01 DIAGNOSIS — G5793 Unspecified mononeuropathy of bilateral lower limbs: Secondary | ICD-10-CM | POA: Diagnosis present

## 2024-11-01 DIAGNOSIS — Z5971 Insufficient health insurance coverage: Secondary | ICD-10-CM

## 2024-11-01 DIAGNOSIS — G8929 Other chronic pain: Principal | ICD-10-CM | POA: Diagnosis present

## 2024-11-01 DIAGNOSIS — Z9049 Acquired absence of other specified parts of digestive tract: Secondary | ICD-10-CM

## 2024-11-01 DIAGNOSIS — R34 Anuria and oliguria: Secondary | ICD-10-CM | POA: Diagnosis present

## 2024-11-01 DIAGNOSIS — Z79899 Other long term (current) drug therapy: Secondary | ICD-10-CM

## 2024-11-01 DIAGNOSIS — E43 Unspecified severe protein-calorie malnutrition: Secondary | ICD-10-CM | POA: Diagnosis present

## 2024-11-01 DIAGNOSIS — M5441 Lumbago with sciatica, right side: Secondary | ICD-10-CM | POA: Diagnosis present

## 2024-11-01 DIAGNOSIS — C7A8 Other malignant neuroendocrine tumors: Secondary | ICD-10-CM | POA: Diagnosis present

## 2024-11-01 DIAGNOSIS — Z7901 Long term (current) use of anticoagulants: Secondary | ICD-10-CM | POA: Diagnosis not present

## 2024-11-01 LAB — URINALYSIS, ROUTINE W REFLEX MICROSCOPIC
Bilirubin Urine: NEGATIVE
Glucose, UA: NEGATIVE mg/dL
Hgb urine dipstick: NEGATIVE
Ketones, ur: NEGATIVE mg/dL
Leukocytes,Ua: NEGATIVE
Nitrite: NEGATIVE
Protein, ur: NEGATIVE mg/dL
Specific Gravity, Urine: 1.013 (ref 1.005–1.030)
pH: 6 (ref 5.0–8.0)

## 2024-11-01 LAB — COMPREHENSIVE METABOLIC PANEL WITH GFR
ALT: 7 U/L (ref 0–44)
AST: 16 U/L (ref 15–41)
Albumin: 4.2 g/dL (ref 3.5–5.0)
Alkaline Phosphatase: 80 U/L (ref 38–126)
Anion gap: 10 (ref 5–15)
BUN: 10 mg/dL (ref 6–20)
CO2: 28 mmol/L (ref 22–32)
Calcium: 9.2 mg/dL (ref 8.9–10.3)
Chloride: 103 mmol/L (ref 98–111)
Creatinine, Ser: 0.8 mg/dL (ref 0.61–1.24)
GFR, Estimated: >60 mL/min (ref >60)
Glucose, Bld: 131 mg/dL — ABNORMAL HIGH (ref 70–99)
Potassium: 3.6 mmol/L (ref 3.5–5.1)
Sodium: 141 mmol/L (ref 135–145)
Total Bilirubin: 0.6 mg/dL (ref 0.0–1.2)
Total Protein: 7.5 g/dL (ref 6.5–8.1)

## 2024-11-01 LAB — CBC WITH DIFFERENTIAL/PLATELET
Abs Immature Granulocytes: 0.01 K/uL (ref 0.00–0.07)
Basophils Absolute: 0 K/uL (ref 0.0–0.1)
Basophils Relative: 0 %
Eosinophils Absolute: 0.1 K/uL (ref 0.0–0.5)
Eosinophils Relative: 2 %
HCT: 37.8 % — ABNORMAL LOW (ref 39.0–52.0)
Hemoglobin: 11.9 g/dL — ABNORMAL LOW (ref 13.0–17.0)
Immature Granulocytes: 0 %
Lymphocytes Relative: 27 %
Lymphs Abs: 1.4 K/uL (ref 0.7–4.0)
MCH: 26.9 pg (ref 26.0–34.0)
MCHC: 31.5 g/dL (ref 30.0–36.0)
MCV: 85.5 fL (ref 80.0–100.0)
Monocytes Absolute: 0.5 K/uL (ref 0.1–1.0)
Monocytes Relative: 10 %
Neutro Abs: 3.2 K/uL (ref 1.7–7.7)
Neutrophils Relative %: 61 %
Platelets: 158 K/uL (ref 150–400)
RBC: 4.42 MIL/uL (ref 4.22–5.81)
RDW: 17.3 % — ABNORMAL HIGH (ref 11.5–15.5)
WBC: 5.3 K/uL (ref 4.0–10.5)
nRBC: 0 % (ref 0.0–0.2)

## 2024-11-01 MED ORDER — ONDANSETRON HCL 4 MG/2ML IJ SOLN: 4.0000 mg | Freq: Four times a day (QID) | INTRAMUSCULAR | Status: DC | PRN

## 2024-11-01 MED ORDER — HYDROMORPHONE HCL 1 MG/ML IJ SOLN
0.5000 mg | INTRAMUSCULAR | Status: DC | PRN
  Administered 2024-11-01 – 2024-11-05 (×20): 0.5 mg via INTRAVENOUS
  Filled 2024-11-01 (×20): qty 0.5

## 2024-11-01 MED ORDER — ONDANSETRON HCL 4 MG PO TABS: 4.0000 mg | ORAL_TABLET | Freq: Four times a day (QID) | ORAL | Status: DC | PRN

## 2024-11-01 MED ORDER — ACETAMINOPHEN 650 MG RE SUPP: 650.0000 mg | Freq: Four times a day (QID) | RECTAL | Status: DC | PRN

## 2024-11-01 MED ORDER — ACETAMINOPHEN 325 MG PO TABS
650.0000 mg | ORAL_TABLET | Freq: Four times a day (QID) | ORAL | Status: DC | PRN
  Administered 2024-11-02 – 2024-11-04 (×4): 650 mg via ORAL
  Filled 2024-11-01 (×4): qty 2

## 2024-11-01 MED ORDER — ENOXAPARIN SODIUM 40 MG/0.4ML IJ SOSY
40.0000 mg | PREFILLED_SYRINGE | INTRAMUSCULAR | Status: DC
Start: 2024-11-02 — End: 2024-11-02

## 2024-11-01 MED ORDER — IOHEXOL 350 MG/ML SOLN
75.0000 mL | Freq: Once | INTRAVENOUS | Status: AC | PRN
  Administered 2024-11-01: 75 mL via INTRAVENOUS

## 2024-11-01 NOTE — Plan of Care (Signed)
   Problem: Activity: Goal: Risk for activity intolerance will decrease Outcome: Progressing   Problem: Coping: Goal: Level of anxiety will decrease Outcome: Progressing

## 2024-11-01 NOTE — ED Notes (Signed)
 ED TO INPATIENT HANDOFF REPORT  ED Nurse Name and Phone #: Venetia CROME, RN   S Name/Age/Gender Dorn LELON Sar 53 y.o. male Room/Bed: APFT23/APFT23  Code Status   Code Status: Prior  Home/SNF/Other Home Patient oriented to: A/ox4 Is this baseline? Yes   Triage Complete: Triage complete  Chief Complaint Sacral mass [M53.3]  Triage Note Pt arrived via POV c/o having a tumor on his spine that may be putting pressure on his nerves causing his lower extremities to feel numb, but now Pt reports for the past 3 days he has been having extreme difficulty micturating.    Allergies Allergies  Allergen Reactions   Bee Venom Anaphylaxis    Level of Care/Admitting Diagnosis ED Disposition     ED Disposition  Admit   Condition  --   Comment  Hospital Area: [facility] [[id number]]  Level of Care: Med-Surg [16]  Diagnosis: Sacral mass [276190]  Admitting Physician: ADEFESO, OLADAPO [[id number]]  Attending Physician: ADEFESO, OLADAPO [[id number]]  Certification:: I certify this patient will need inpatient services for at least 2 midnights  Expected Medical Readiness: 11/04/2024          B Medical/Surgery History Past Medical History:  Diagnosis Date   Cancer (HCC)    DVT of lower extremity (deep venous thrombosis) (HCC)    left leg   Hyperlipemia    Hypertension    Past Surgical History:  Procedure Laterality Date   CHOLECYSTECTOMY     HERNIA REPAIR     LOWER EXTREMITY INTERVENTION  09/09/2024   Procedure: LOWER EXTREMITY INTERVENTION;  Surgeon: Gretta Lonni PARAS, MD;  Location: [facility] INVASIVE CV LAB;  Service: Cardiovascular;;   LOWER EXTREMITY VENOGRAPHY N/A 09/09/2024   Procedure: LOWER EXTREMITY VENOGRAPHY;  Surgeon: Gretta Lonni PARAS, MD;  Location: [facility] INVASIVE CV LAB;  Service: Cardiovascular;  Laterality: N/A;   PERIPHERAL VASCULAR THROMBECTOMY N/A 09/09/2024   Procedure: PERIPHERAL VASCULAR THROMBECTOMY;  Surgeon: Gretta Lonni PARAS, MD;  Location: [facility]  INVASIVE CV LAB;  Service: Cardiovascular;  Laterality: N/A;   TONSILLECTOMY       A IV Location/Drains/Wounds Patient Lines/Drains/Airways Status     Active Line/Drains/Airways     Name Placement date Placement time Site Days   Peripheral IV 11/01/24 20 G 1 Anterior;Proximal;Right Forearm 11/01/24  2006  Forearm  less than 1            Intake/Output Last 24 hours  Intake/Output Summary (Last 24 hours) at 11/01/2024 2224 Last data filed at 11/01/2024 2129 Gross per 24 hour  Intake --  Output 300 ml  Net -300 ml    Labs/Imaging Results for orders placed or performed during the hospital encounter of 11/01/24 (from the past 48 hours)  Urinalysis, Routine w reflex microscopic -Urine, Clean Catch     Status: None   Collection Time: 11/01/24  3:33 PM  Result Value Ref Range   Color, Urine YELLOW YELLOW   APPearance CLEAR CLEAR   Specific Gravity, Urine 1.013 1.005 - 1.030   pH 6.0 5.0 - 8.0   Glucose, UA NEGATIVE NEGATIVE mg/dL   Hgb urine dipstick NEGATIVE NEGATIVE   Bilirubin Urine NEGATIVE NEGATIVE   Ketones, ur NEGATIVE NEGATIVE mg/dL   Protein, ur NEGATIVE NEGATIVE mg/dL   Nitrite NEGATIVE NEGATIVE   Leukocytes,Ua NEGATIVE NEGATIVE    Comment: Performed at [facility], [address], [place], [place] [address]  CBC with Differential     Status: Abnormal   Collection Time: 11/01/24  4:02 PM  Result Value  Ref Range   WBC 5.3 4.0 - 10.5 K/uL   RBC 4.42 4.22 - 5.81 MIL/uL   Hemoglobin 11.9 (L) 13.0 - 17.0 g/dL   HCT 62.1 (L) 60.9 - 47.9 %   MCV 85.5 80.0 - 100.0 fL   MCH 26.9 26.0 - 34.0 pg   MCHC 31.5 30.0 - 36.0 g/dL   RDW 82.6 (H) 88.4 - 84.4 %   Platelets 158 150 - 400 K/uL   nRBC 0.0 0.0 - 0.2 %   Neutrophils Relative % 61 %   Neutro Abs 3.2 1.7 - 7.7 K/uL   Lymphocytes Relative 27 %   Lymphs Abs 1.4 0.7 - 4.0 K/uL   Monocytes Relative 10 %   Monocytes Absolute 0.5 0.1 - 1.0 K/uL   Eosinophils Relative 2 %   Eosinophils Absolute 0.1 0.0 - 0.5 K/uL    Basophils Relative 0 %   Basophils Absolute 0.0 0.0 - 0.1 K/uL   Immature Granulocytes 0 %   Abs Immature Granulocytes 0.01 0.00 - 0.07 K/uL    Comment: Performed at [facility], [address] [place], [place] [address]  Comprehensive metabolic panel     Status: Abnormal   Collection Time: 11/01/24  4:02 PM  Result Value Ref Range   Sodium 141 135 - 145 mmol/L   Potassium 3.6 3.5 - 5.1 mmol/L   Chloride 103 98 - 111 mmol/L   CO2 28 22 - 32 mmol/L   Glucose, Bld 131 (H) 70 - 99 mg/dL    Comment: Glucose reference range applies only to samples taken after fasting for at least 8 hours.   BUN 10 6 - 20 mg/dL   Creatinine, Ser 9.19 0.61 - 1.24 mg/dL   Calcium 9.2 8.9 - 89.6 mg/dL   Total Protein 7.5 6.5 - 8.1 g/dL   Albumin 4.2 3.5 - 5.0 g/dL   AST 16 15 - 41 U/L   ALT 7 0 - 44 U/L   Alkaline Phosphatase 80 38 - 126 U/L   Total Bilirubin 0.6 0.0 - 1.2 mg/dL   GFR, Estimated >39 >39 mL/min    Comment: (NOTE) Calculated using the CKD-EPI Creatinine Equation (2021)    Anion gap 10 5 - 15    Comment: Performed at [facility], [address], [place], [place] [address]   CT Angio Chest PE W and/or Wo Contrast Result Date: 11/01/2024 CLINICAL DATA:  History of spinal neuroendocrine tumor, prior DVT, concern for pulmonary embolus EXAM: CT ANGIOGRAPHY CHEST WITH CONTRAST TECHNIQUE: Multidetector CT imaging of the chest was performed using the standard protocol during bolus administration of intravenous contrast. Multiplanar CT image reconstructions and MIPs were obtained to evaluate the vascular anatomy. RADIATION DOSE REDUCTION: This exam was performed according to the departmental dose-optimization program which includes automated exposure control, adjustment of the mA and/or kV according to patient size and/or use of iterative reconstruction technique. CONTRAST:  75mL OMNIPAQUE  IOHEXOL  350 MG/ML SOLN COMPARISON:  05/13/2024, 06/02/2024 FINDINGS: Cardiovascular: This is a technically adequate  evaluation of the pulmonary vasculature. No filling defects or pulmonary emboli. The heart is unremarkable without pericardial effusion. Normal caliber of the thoracic aorta. Mediastinum/Nodes: No enlarged mediastinal, hilar, or axillary lymph nodes. Thyroid  gland, trachea, and esophagus demonstrate no significant findings. Lungs/Pleura: No acute airspace disease, effusion, or pneumothorax. Central airways are widely patent. Upper Abdomen: No acute abnormality. Musculoskeletal: There are no acute displaced fractures. The known metastatic lesion within the right T12 pedicle is not readily apparent by CT. Reconstructed images demonstrate  no additional findings. Review of the MIP images confirms the above findings. IMPRESSION: 1. No evidence of pulmonary embolus. 2. No acute intrathoracic process. 3. The known T12 metastatic lesion seen on prior PET scan is not readily apparent by CT. Electronically Signed   By: Ozell Daring M.D.   On: 11/01/2024 21:40   MR LUMBAR SPINE WO CONTRAST Result Date: 11/01/2024 EXAM: MRI Lumbar Spine 11/01/2024 06:49:17 PM TECHNIQUE: Multiplanar multisequence MRI of the lumbar spine was performed without the administration of intravenous contrast. COMPARISON: MRI lumbar spine May 12, 2024 CLINICAL HISTORY: Low back pain, cauda equina syndrome suspected FINDINGS: Segmentation: Same numbering as on the prior with partially lumbarized S1 vertebral body. BONES AND ALIGNMENT: Mildly decreased conspicuity of STIR hyperintense T12 and L5 vertebral body lesions which are likely similar in size. The absence of contrast precludes evaluation for enhancing lesions. Grade 1 anterolisthesis of L5 on S1. SPINAL CORD: The conus terminates normally. SOFT TISSUES: Similar versus mildly increased size of a large (8.5 x 2.8 x 5.7 cm) mass along the dorsal aspect of the sacrum which fills the sacral canal and sacral foramina. L1-L2: No significant disc herniation. No spinal canal stenosis or neural  foraminal narrowing. L2-L3: Arthropathy and ligamentum flavum thickening. Mild right subarticular recess stenosis and bilateral foraminal stenosis. No significant canal stenosis. L3-L4: Disc bulge and bilateral facet arthropathy. Similar moderate right subarticular recess stenosis and moderate right greater than left foraminal stenosis. Patent central canal. L4-L5: Right disc bulge with ligamentum flavum thickening. Bilateral facet arthropathy. Similar mild canal stenosis. Similar mild foraminal stenosis. L5-S1: Grade 1 anterolisthesis and uncovering of the disc. The sacral mass described above fills the canal. Similar moderate bilateral foraminal stenosis. S1-S2:  The sacral mass described above fills the canal. IMPRESSION: 1. Mildly decreased conspicuity of STIR hyperintense T12 and L5 vertebral body lesions which are likely similar in size. 2. Similar versus mildly increased size of a large (8.5 x 2.8 x 5.7 cm) mass along the dorsal aspect of the sacrum which fills the sacral canal and sacral foramina, partially imaged. 3. Similar multilevel degenerative changes and foraminal stenosis, as detailed above. Electronically signed by: Gilmore Molt MD 11/01/2024 08:45 PM EST RP Workstation: [id number]    Pending Labs Unresulted Labs (From admission, onward)    None       Vitals/Pain Today's Vitals   11/01/24 1509 11/01/24 2010 11/01/24 2019 11/01/24 2045  BP: (!) 148/105 (!) 148/94  (!) 143/92  Pulse: 91 72  73  Resp: 20 16  20   Temp:  98.8 F (37.1 C)    TempSrc:      SpO2: 99% 100%  99%  Weight:      Height:      PainSc:   4      Isolation Precautions No active isolations  Medications Medications  iohexol  (OMNIPAQUE ) 350 MG/ML injection 75 mL (75 mLs Intravenous Contrast Given 11/01/24 2111)    Mobility walks     Focused Assessments Neuro Assessment Handoff:  Swallow screen pass? Yes          Neuro Assessment: Exceptions to WDL Neuro Checks:      Has TPA been given?  No If patient is a Neuro Trauma and patient is going to OR before floor call report to 4N Charge nurse: [phone] or [phone]   R Recommendations: See Admitting Provider Note  Report given to:   Additional Notes: 6M with chronic lower back pain. Says he has been having worsening problems with sciatica for several  months. Tumor he says has been pressing on his spine causing intermittent numbness worse in his groin area. This morning he notes difficulty urinating and dribbling on himself. Post void bladder scan showed 200cc urine. Pt alert, oriented and displays no signs of distress.

## 2024-11-01 NOTE — ED Triage Notes (Signed)
 Pt arrived via POV c/o having a tumor on his spine that may be putting pressure on his nerves causing his lower extremities to feel numb, but now Pt reports for the past 3 days he has been having extreme difficulty micturating.

## 2024-11-01 NOTE — H&P (Signed)
 History and Physical    Patient: Carlos Sullivan FMW:[id number] DOB: 1971/11/06 DOA: 11/01/2024 DOS: the patient was seen and examined on 11/01/2024 PCP: Patient, [name]  Patient coming from: {Point_of_Origin:[address]}  Chief Complaint:  Chief Complaint  Patient presents with   oliguria   HPI: Carlos Sullivan is a 53 y.o. male with medical history significant of ***  Review of Systems: {ROS_Text:26778} Past Medical History:  Diagnosis Date   Cancer (HCC)    DVT of lower extremity (deep venous thrombosis) (HCC)    left leg   Hyperlipemia    Hypertension    Past Surgical History:  Procedure Laterality Date   CHOLECYSTECTOMY     HERNIA REPAIR     LOWER EXTREMITY INTERVENTION  09/09/2024   Procedure: LOWER EXTREMITY INTERVENTION;  Surgeon: Gretta Lonni PARAS, MD;  Location: [facility] INV[facility];  Service: Cardiovascular;;   LOWER EXTREMITY VENOGRAPHY N/A 09/09/2024   Procedure: LOWER EXTREMITY VENOGRAPHY;  Surgeon: Gretta Lonni PARAS, MD;  Location: [facility] INV[facility];  Service: Cardiovascular;  Laterality: N/A;   PERIPHERAL VASCULAR THROMBECTOMY N/A 09/09/2024   Procedure: PERIPHERAL VASCULAR THROMBECTOMY;  Surgeon: Gretta Lonni PARAS, MD;  Location: [facility] INVASIVE CV LAB;  Service: Cardiovascular;  Laterality: N/A;   TONSILLECTOMY     Social History:  reports that he has been smoking cigarettes. He has been exposed to tobacco smoke. He does not have any smokeless tobacco history on file. He reports that he does not drink alcohol and does not use drugs.  Allergies  Allergen Reactions   Bee Venom Anaphylaxis    History reviewed. No pertinent family history.  Prior to Admission medications   Medication Sig Start Date End Date Taking? Authorizing Provider  acetaminophen  (TYLENOL ) 325 MG tablet Take 650 mg by mouth every 6 (six) hours as needed for moderate pain (pain score 4-6).    [provider]  Buprenorphine  HCl-Naloxone  HCl 8-2 MG FILM Place 1 Film under the  tongue in the morning, at noon, and at bedtime. 04/24/24   [provider]  naproxen (NAPROSYN) 500 MG tablet Take 1 tablet (500 mg total) by mouth 2 (two) times daily. 09/28/24   Horton, Charmaine FALCON, MD  RIVAROXABAN  (XARELTO ) VTE STARTER PACK (15 & 20 MG) Follow package directions: Take one 15mg  tablet by mouth twice a day. On day 22, switch to one 20mg  tablet once a day. Take with food. 09/10/24   Gretta Lonni PARAS, MD  traZODone (DESYREL) 50 MG tablet Take 1 tablet (50 mg total) by mouth at bedtime as needed for sleep. 09/28/24   Bari Charmaine FALCON, MD    Physical Exam: Vitals:   11/01/24 1506 11/01/24 1509 11/01/24 1509 11/01/24 2010  BP:   (!) 148/105 (!) 148/94  Pulse:   91 72  Resp:   20 16  Temp: 99.3 F (37.4 C)   98.8 F (37.1 C)  TempSrc: Temporal     SpO2:   99% 100%  Weight:  90.7 kg    Height:  6' 3 (1.905 m)     *** Data Reviewed: {Tip this will not be part of the note when signed- Document your independent interpretation of telemetry tracing, EKG, lab, Radiology test or any other diagnostic tests. Add any new diagnostic test ordered today. (Optional):26781} {Results:26384}  Assessment and Plan: No notes have been filed under this hospital service. Service: Hospitalist     Advance Care Planning:   Code Status: Prior ***  Consults: ***  Family Communication: ***  Severity of Illness: {Observation/Inpatient:21159}  Author: Posey Maier, DO 11/01/2024 10:15 PM  For on call review [email or web address].

## 2024-11-01 NOTE — ED Provider Notes (Deleted)
 I never saw this patient. Left after triage. I tried to call the patient given the concerning triage note. Unable to reach the patient. Nurse tried to call as well.    Simon Lavonia SAILOR, MD 11/01/24 [phone]

## 2024-11-01 NOTE — ED Notes (Signed)
 Pt was in MRI when this RN tried calling for him. Pt returned to VT2 at t his time.

## 2024-11-01 NOTE — ED Provider Notes (Signed)
  EMERGENCY DEPARTMENT AT [facility] Provider Note   CSN: [id number] Arrival date & time: 11/01/24  1454     Patient presents with: oliguria   Carlos Sullivan is a 53 y.o. male.   HPI  Patient presents because of back pain as well as difficulty urinating.  Patient states he has had back pain for a long time but the difficulty urinating has been present over the past week.  Feels like he is not really able to start her stream.  Feels like he is try to bear down but is not able to start a steady stream and only really dribbles.  Endorses numbness premed from the waist down.  Including his groin.  Patient states he did have episode of bowel incontinence today whenever he stood up.  Did not realize he needed to go to the restroom and subsequently had episode of incontinence.  Difficulty walking because of bilateral leg weakness.  Endorses bilateral numbness as well.  Currently not on anticoagulation.  He states that the Eliquis  was making him feel cold.  He has not been taking this for some time.  Also states that he has not been follow-up with oncology because of transportation issues.  Has not followed up regarding his known metastatic cancer.    Previous medical history reviewed : Patient was admitted in May 2025.  Left iliac and common femoral vein thrombosis.  Eventually had to have a thrombectomy.  Also found to have a vertebral mass in the sacrum at that time.  The mass was biopsied.  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 PET dotatate scan on 06/02/2024 showed intense radiotracer activity associated with the soft tissue sacral lesion consistent with Well differentiated neuroendocrine tumor. 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 Radiotracer avid lesion between the head of the pancreas and the second portion duodenum. Differential include well differentiated neuroendocrine  tumor in the ampullary region of the pancreas/duodenum versus Neuroendocrine tumor metastatic lymph node. Small metastatic neuroendocrine tumor lymph node ventral to the aorta. No evidence of liver metastasis. No bowel lesion identified. No thoracic metastasis.  Given overall disease burden, plan is to proceed with monthly octreotide injections.  Tentative start date during the week of 06/16/2024.      Prior to Admission medications   Medication Sig Start Date End Date Taking? Authorizing Provider  acetaminophen  (TYLENOL ) 325 MG tablet Take 650 mg by mouth every 6 (six) hours as needed for moderate pain (pain score 4-6).    [provider]  Buprenorphine  HCl-Naloxone  HCl 8-2 MG FILM Place 1 Film under the tongue in the morning, at noon, and at bedtime. 04/24/24   [provider]  naproxen (NAPROSYN) 500 MG tablet Take 1 tablet (500 mg total) by mouth 2 (two) times daily. 09/28/24   Horton, Charmaine FALCON, MD  RIVAROXABAN  (XARELTO ) VTE STARTER PACK (15 & 20 MG) Follow package directions: Take one 15mg  tablet by mouth twice a day. On day 22, switch to one 20mg  tablet once a day. Take with food. 09/10/24   Gretta Lonni PARAS, MD  traZODone (DESYREL) 50 MG tablet Take 1 tablet (50 mg total) by mouth at bedtime as needed for sleep. 09/28/24   Horton, Charmaine FALCON, MD    Allergies: Bee venom    Review of Systems  Constitutional:  Negative for chills and fever.  HENT:  Negative for ear pain and sore throat.   Eyes:  Negative for pain and  visual disturbance.  Respiratory:  Negative for cough and shortness of breath.   Cardiovascular:  Negative for chest pain and palpitations.  Gastrointestinal:  Negative for abdominal pain and vomiting.  Genitourinary:  Negative for dysuria and hematuria.  Musculoskeletal:  Negative for arthralgias and back pain.  Skin:  Negative for color change and rash.  Neurological:  Negative for seizures and syncope.  All other systems reviewed and are  negative.   Updated Vital Signs BP (!) 143/92   Pulse 73   Temp 98.8 F (37.1 C)   Resp 20   Ht 6' 3 (1.905 m)   Wt 90.7 kg   SpO2 99%   BMI 25.00 kg/m   Physical Exam Vitals and nursing note reviewed.  Constitutional:      General: He is not in acute distress.    Appearance: He is well-developed.  HENT:     Head: Normocephalic and atraumatic.  Eyes:     Conjunctiva/sclera: Conjunctivae normal.  Cardiovascular:     Rate and Rhythm: Normal rate and regular rhythm.     Heart sounds: No murmur heard. Pulmonary:     Effort: Pulmonary effort is normal. No respiratory distress.     Breath sounds: Normal breath sounds.  Abdominal:     Palpations: Abdomen is soft.     Tenderness: There is no abdominal tenderness.  Musculoskeletal:        General: No swelling.     Cervical back: Neck supple.  Skin:    General: Skin is warm and dry.     Capillary Refill: Capillary refill takes less than 2 seconds.  Neurological:     Mental Status: He is alert.     GCS: GCS eye subscore is 4. GCS verbal subscore is 5. GCS motor subscore is 6.     Comments: RLE: intact strength at the hip with 5/5 strength and equal plantar/dorsiflexion.   LLE: intact strength at the hip with 5/5 strength and equal plantar/dorsiflexion   Psychiatric:        Mood and Affect: Mood normal.     (all labs ordered are listed, but only abnormal results are displayed) Labs Reviewed  CBC WITH DIFFERENTIAL/PLATELET - Abnormal; Notable for the following components:      Result Value   Hemoglobin 11.9 (*)    HCT 37.8 (*)    RDW 17.3 (*)    All other components within normal limits  COMPREHENSIVE METABOLIC PANEL WITH GFR - Abnormal; Notable for the following components:   Glucose, Bld 131 (*)    All other components within normal limits  URINALYSIS, ROUTINE W REFLEX MICROSCOPIC    EKG: EKG Interpretation Date/Time:  Monday November 01 2024 20:11:11 EST Ventricular Rate:  80 PR Interval:  163 QRS  Duration:  98 QT Interval:  389 QTC Calculation: 446 R Axis:   93  Text Interpretation: Sinus rhythm LAE, consider biatrial enlargement Borderline right axis deviation ST elev, probable normal early repol pattern Confirmed by Simon Rea [phone]) on 11/01/2024 9:06:05 PM  Radiology: CT Angio Chest PE W and/or Wo Contrast Result Date: 11/01/2024 CLINICAL DATA:  History of spinal neuroendocrine tumor, prior DVT, concern for pulmonary embolus EXAM: CT ANGIOGRAPHY CHEST WITH CONTRAST TECHNIQUE: Multidetector CT imaging of the chest was performed using the standard protocol during bolus administration of intravenous contrast. Multiplanar CT image reconstructions and MIPs were obtained to evaluate the vascular anatomy. RADIATION DOSE REDUCTION: This exam was performed according to the departmental dose-optimization program which includes automated exposure control, adjustment  of the mA and/or kV according to patient size and/or use of iterative reconstruction technique. CONTRAST:  75mL OMNIPAQUE  IOHEXOL  350 MG/ML SOLN COMPARISON:  05/13/2024, 06/02/2024 FINDINGS: Cardiovascular: This is a technically adequate evaluation of the pulmonary vasculature. No filling defects or pulmonary emboli. The heart is unremarkable without pericardial effusion. Normal caliber of the thoracic aorta. Mediastinum/Nodes: No enlarged mediastinal, hilar, or axillary lymph nodes. Thyroid  gland, trachea, and esophagus demonstrate no significant findings. Lungs/Pleura: No acute airspace disease, effusion, or pneumothorax. Central airways are widely patent. Upper Abdomen: No acute abnormality. Musculoskeletal: There are no acute displaced fractures. The known metastatic lesion within the right T12 pedicle is not readily apparent by CT. Reconstructed images demonstrate no additional findings. Review of the MIP images confirms the above findings. IMPRESSION: 1. No evidence of pulmonary embolus. 2. No acute intrathoracic process. 3. The known  T12 metastatic lesion seen on prior PET scan is not readily apparent by CT. Electronically Signed   By: Ozell Daring M.D.   On: 11/01/2024 21:40   MR LUMBAR SPINE WO CONTRAST Result Date: 11/01/2024 EXAM: MRI Lumbar Spine 11/01/2024 06:49:17 PM TECHNIQUE: Multiplanar multisequence MRI of the lumbar spine was performed without the administration of intravenous contrast. COMPARISON: MRI lumbar spine May 12, 2024 CLINICAL HISTORY: Low back pain, cauda equina syndrome suspected FINDINGS: Segmentation: Same numbering as on the prior with partially lumbarized S1 vertebral body. BONES AND ALIGNMENT: Mildly decreased conspicuity of STIR hyperintense T12 and L5 vertebral body lesions which are likely similar in size. The absence of contrast precludes evaluation for enhancing lesions. Grade 1 anterolisthesis of L5 on S1. SPINAL CORD: The conus terminates normally. SOFT TISSUES: Similar versus mildly increased size of a large (8.5 x 2.8 x 5.7 cm) mass along the dorsal aspect of the sacrum which fills the sacral canal and sacral foramina. L1-L2: No significant disc herniation. No spinal canal stenosis or neural foraminal narrowing. L2-L3: Arthropathy and ligamentum flavum thickening. Mild right subarticular recess stenosis and bilateral foraminal stenosis. No significant canal stenosis. L3-L4: Disc bulge and bilateral facet arthropathy. Similar moderate right subarticular recess stenosis and moderate right greater than left foraminal stenosis. Patent central canal. L4-L5: Right disc bulge with ligamentum flavum thickening. Bilateral facet arthropathy. Similar mild canal stenosis. Similar mild foraminal stenosis. L5-S1: Grade 1 anterolisthesis and uncovering of the disc. The sacral mass described above fills the canal. Similar moderate bilateral foraminal stenosis. S1-S2:  The sacral mass described above fills the canal. IMPRESSION: 1. Mildly decreased conspicuity of STIR hyperintense T12 and L5 vertebral body lesions which  are likely similar in size. 2. Similar versus mildly increased size of a large (8.5 x 2.8 x 5.7 cm) mass along the dorsal aspect of the sacrum which fills the sacral canal and sacral foramina, partially imaged. 3. Similar multilevel degenerative changes and foraminal stenosis, as detailed above. Electronically signed by: Gilmore Molt MD 11/01/2024 08:45 PM EST RP Workstation: [id number]     Procedures   Medications Ordered in the ED  iohexol  (OMNIPAQUE ) 350 MG/ML injection 75 mL (75 mLs Intravenous Contrast Given 11/01/24 2111)    Clinical Course as of 11/01/24 2238  Mon Nov 01, 2024  2131 Post void of 240 cc [TL]  2137 PA Neurosurgery Johnanna: No acute intervention needed by NS. No IV steroids  [TL]    Clinical Course User Index [TL] Simon Lavonia SAILOR, MD  Medical Decision Making Amount and/or Complexity of Data Reviewed Labs: ordered. Radiology: ordered.  Risk Prescription drug management. Decision regarding hospitalization.     HPI:   Patient presents because of back pain as well as difficulty urinating.  Patient states he has had back pain for a long time but the difficulty urinating has been present over the past week.  Feels like he is not really able to start her stream.  Feels like he is try to bear down but is not able to start a steady stream and only really dribbles.  Endorses numbness premed from the waist down.  Including his groin.  Patient states he did have episode of bowel incontinence today whenever he stood up.  Did not realize he needed to go to the restroom and subsequently had episode of incontinence.  Difficulty walking because of bilateral leg weakness.  Endorses bilateral numbness as well.  Currently not on anticoagulation.  He states that the Eliquis  was making him feel cold.  He has not been taking this for some time.  Also states that he has not been follow-up with oncology because of transportation issues.  Has not  followed up regarding his known metastatic cancer.  Patient states he did have episode of chest pain approximate 2 days ago pleuritic in nature.  Not currently taken to coagulation.    Previous medical history reviewed : Patient was admitted in May 2025.  Left iliac and common femoral vein thrombosis.  Eventually had to have a thrombectomy.  Also found to have a vertebral mass in the sacrum at that time.  The mass was biopsied.  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 PET dotatate scan on 06/02/2024 showed intense radiotracer activity associated with the soft tissue sacral lesion consistent with Well differentiated neuroendocrine tumor. 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 Radiotracer avid lesion between the head of the pancreas and the second portion duodenum. Differential include well differentiated neuroendocrine tumor in the ampullary region of the pancreas/duodenum versus Neuroendocrine tumor metastatic lymph node. Small metastatic neuroendocrine tumor lymph node ventral to the aorta. No evidence of liver metastasis. No bowel lesion identified. No thoracic metastasis.  Given overall disease burden, plan is to proceed with monthly octreotide injections.  Tentative start date during the week of 06/16/2024.  MDM:   Upon exam, patient ANO x 3 with GCS 15.  Patient endorses some weakness of his lower extremities as well as numbness pretty much from the waist down including his genital area.  Patient has 5 out of 5 strength out of bilateral lower extremities at the hips.  Appropriate plantar and dorsiflexion of bilateral feet.  Endorses some tingling sensation of his lower extremities but able to discern which leg I am touching on sensory exam.  Endorses GU numbness as well as the bowel incontinence and difficulty starting a stream.  Ordered a postvoid evaluation.  My largest concern given his above history  is likely worsening metastatic disease process and increasing cancer burden of his lumbar/sacroiliac area causing worsening symptoms including symptoms concerning for cauda equina or other central cord process.   In of chest pain that happened 2 days ago.  No pleuritic chest pain.  No hemoptysis.  No tachypnea or tachycardia but he did have sharp chest pain.  History of DVT.  Currently not on anticoagulation.  Obvious risk factors for PE.  Will obtain CT of the chest as well.  Reevaluation:   Upon reexamination, patient hemodynamically  stable.  Remains A&O x 3 with GCS 15.  In terms of patient's chest pain in the setting of patient being on anticoagulation as well as history of recurrent DVTs, did obtain CTA of the chest.  Negative PE workup.  No acute pathology in the pulmonary vessels.  EKG unremarkable.  No occasion for ACS workup at this time.  No exertional chest pain.  Asymptomatic at this time   In terms of patient's back pain.  Postvoid of 240 cc.  Once again, strength was intact in lower extremities but did endorse some sensory changes.  Positive bowel incontinence.  Positive for saddle anesthesia as well.  MRI lumbar without contrast as above.  Large sacral mass.  No obvious central canal stenosis of the lumbar spine that I could see.  I did speak to neurosurgery PA Johnanna.  No acute intervention needed from their perspective at this point time.  Defer to oncology.  No indication for steroids or immediate debulking.  Given patient being lost to follow-up, difficulty receiving care, difficulty voiding, increasing weakness, patient admitted for further care     EKG Interpreted by Me: nsr   Cardiac Tele Interpreted by Me: NSR    I have independently interpreted the CT  images and agree with the radiologist finding   Social Determinant of Health: Medical noncompliance, lost to follow up    Disposition and Follow Up: admit         Final diagnoses:  Chronic bilateral low  back pain with bilateral sciatica  Sacral mass  Urinary retention  Saddle anesthesia  Pleurodynia    ED Discharge Orders     None          Simon Lavonia SAILOR, MD 11/01/24 2238

## 2024-11-02 DIAGNOSIS — E43 Unspecified severe protein-calorie malnutrition: Secondary | ICD-10-CM | POA: Insufficient documentation

## 2024-11-02 LAB — COMPREHENSIVE METABOLIC PANEL WITH GFR
ALT: 5 U/L (ref 0–44)
AST: 12 U/L — ABNORMAL LOW (ref 15–41)
Albumin: 3.6 g/dL (ref 3.5–5.0)
Alkaline Phosphatase: 66 U/L (ref 38–126)
Anion gap: 7 (ref 5–15)
BUN: 10 mg/dL (ref 6–20)
CO2: 29 mmol/L (ref 22–32)
Calcium: 8.5 mg/dL — ABNORMAL LOW (ref 8.9–10.3)
Chloride: 107 mmol/L (ref 98–111)
Creatinine, Ser: 0.8 mg/dL (ref 0.61–1.24)
GFR, Estimated: >60 mL/min (ref >60)
Glucose, Bld: 80 mg/dL (ref 70–99)
Potassium: 3.3 mmol/L — ABNORMAL LOW (ref 3.5–5.1)
Sodium: 142 mmol/L (ref 135–145)
Total Bilirubin: 0.4 mg/dL (ref 0.0–1.2)
Total Protein: 6.2 g/dL — ABNORMAL LOW (ref 6.5–8.1)

## 2024-11-02 LAB — PHOSPHORUS: Phosphorus: 4 mg/dL (ref 2.5–4.6)

## 2024-11-02 LAB — CBC
HCT: 32 % — ABNORMAL LOW (ref 39.0–52.0)
Hemoglobin: 10.1 g/dL — ABNORMAL LOW (ref 13.0–17.0)
MCH: 26.6 pg (ref 26.0–34.0)
MCHC: 31.6 g/dL (ref 30.0–36.0)
MCV: 84.4 fL (ref 80.0–100.0)
Platelets: 131 K/uL — ABNORMAL LOW (ref 150–400)
RBC: 3.79 MIL/uL — ABNORMAL LOW (ref 4.22–5.81)
RDW: 17.3 % — ABNORMAL HIGH (ref 11.5–15.5)
WBC: 6.1 K/uL (ref 4.0–10.5)
nRBC: 0 % (ref 0.0–0.2)

## 2024-11-02 LAB — MAGNESIUM: Magnesium: 2.2 mg/dL (ref 1.7–2.4)

## 2024-11-02 MED ORDER — OXYCODONE HCL ER 10 MG PO T12A
10.0000 mg | EXTENDED_RELEASE_TABLET | Freq: Two times a day (BID) | ORAL | Status: DC
  Administered 2024-11-02 – 2024-11-05 (×7): 10 mg via ORAL
  Filled 2024-11-02 (×7): qty 1

## 2024-11-02 MED ORDER — ENSURE PLUS HIGH PROTEIN PO LIQD
237.0000 mL | Freq: Three times a day (TID) | ORAL | Status: DC
  Administered 2024-11-02 – 2024-11-05 (×6): 237 mL via ORAL

## 2024-11-02 MED ORDER — TAMSULOSIN HCL 0.4 MG PO CAPS
0.4000 mg | ORAL_CAPSULE | Freq: Every day | ORAL | Status: DC
Start: 2024-11-02 — End: 2024-11-05
  Administered 2024-11-02 – 2024-11-05 (×4): 0.4 mg via ORAL
  Filled 2024-11-02 (×4): qty 1

## 2024-11-02 MED ORDER — HYDRALAZINE HCL 20 MG/ML IJ SOLN: 10.0000 mg | Freq: Four times a day (QID) | INTRAMUSCULAR | Status: DC | PRN

## 2024-11-02 MED ORDER — ADULT MULTIVITAMIN W/MINERALS CH
1.0000 | ORAL_TABLET | Freq: Every day | ORAL | Status: DC
  Administered 2024-11-02 – 2024-11-05 (×4): 1 via ORAL
  Filled 2024-11-02 (×4): qty 1

## 2024-11-02 NOTE — Progress Notes (Signed)
 Initial Nutrition Assessment  DOCUMENTATION CODES:   Severe malnutrition in context of chronic illness  INTERVENTION:   Ensure Plus High Protein po TID, each supplement provides 350 kcal and 20 grams of protein Liberalize diet to regular Allow double portions with meals MVI with minerals daily  NUTRITION DIAGNOSIS:   Severe Malnutrition related to chronic illness (sacral mass) as evidenced by severe muscle depletion, severe fat depletion, percent weight loss (11% weight loss x 6 months).  GOAL:   Patient will meet greater than or equal to 90% of their needs  MONITOR:   PO intake, Supplement acceptance  REASON FOR ASSESSMENT:   Malnutrition Screening Tool    ASSESSMENT:   53 yo male admitted with chronic back pain and urinary retention d/t sacral mass. PMH includes HLD, HTN, opiod use disorder, LE DVT, metastatic neuroendocrine tumor (resected in 2018 at [facility]) with recently dx sacral mass.  Patient reports that he has been eating well. He lives with a friend because he is currently unemployed and eats what is there. For breakfast today, he ate everything, but thought the portion sizes were too small. He agreed to receive double portions as well as Ensure supplements between meals to maximize protein and calorie intake.   Labs reviewed.  K 3.3  Medications reviewed.  Weight history reviewed. Patient with 11% weight loss within the past 6 months.   Patient meets criteria for severe malnutrition, given severe depletion of muscle and subcutaneous fat mass with 11% weight loss x 6 months.  NUTRITION - FOCUSED PHYSICAL EXAM:  Flowsheet Row Most Recent Value  Orbital Region Severe depletion  Upper Arm Region Severe depletion  Thoracic and Lumbar Region Severe depletion  Buccal Region Severe depletion  Temple Region Severe depletion  Clavicle Bone Region Severe depletion  Clavicle and Acromion Bone Region Severe depletion  Scapular Bone Region Severe depletion  Dorsal  Hand Mild depletion  Patellar Region Unable to assess  Anterior Thigh Region Unable to assess  Posterior Calf Region Unable to assess  Edema (RD Assessment) Unable to assess  Hair Reviewed  Eyes Reviewed  Mouth Reviewed  Skin Reviewed  Nails Reviewed    Diet Order:   Diet Order             Diet NPO time specified  Diet effective midnight           Diet Heart Room service appropriate? Yes; Fluid consistency: Thin  Diet effective now                   EDUCATION NEEDS:   Education needs have been addressed  Skin:  Skin Assessment: Reviewed RN Assessment  Last BM:  11/3  Height:   Ht Readings from Last 1 Encounters:  11/01/24 6' 3 (1.905 m)    Weight:   Wt Readings from Last 1 Encounters:  11/01/24 88 kg    BMI:  Body mass index is 24.25 kg/m.  Estimated Nutritional Needs:   Kcal:  2600-2800  Protein:  130-150 gm  Fluid:  >/= 2.6 L   Suzen HUNT RD, LDN, CNSC Contact via secure chat. If unavailable, use group chat RD Inpatient.

## 2024-11-02 NOTE — Evaluation (Signed)
 Occupational Therapy Evaluation Patient Details Name: Carlos Sullivan MRN: [id number] DOB: 1971-06-13 Today's Date: 11/02/2024   History of Present Illness   Carlos Sullivan is a 53 y.o. male with medical history significant of hypertension, opioid use disorder, LLE DVT s/p thrombectomy and unclear history of intestinal mass in 2018 who presents to the emergency department due to back pain and difficulty in urination.  Patient endorsed chronic back pain with right-sided sciatica, but complained of few days onset of difficulty in urination in which he needed to bear down sometimes just to be able to obtain some dribbling of urine.  He complained of numbness from the waist down including groin and endorsed an episode of bowel incontinence today, he also complained of bilateral leg weakness and numbness with difficulty in ambulation.     Clinical Impressions Pt admitted for concerns listed above. PTA pt reported that he was independent with all ADL's and IADL's, including community mobility. At this time, pt presents with mild pain in his back, otherwise at his baseline. He continues to be independent with all ADL's and IADL's, as well as functional mobility with no AD. Pt has no further skilled OT need and will be discharged from acute OT.      If plan is discharge home, recommend the following:         Functional Status Assessment   Patient has had a recent decline in their functional status and demonstrates the ability to make significant improvements in function in a reasonable and predictable amount of time.     Equipment Recommendations   None recommended by OT     Recommendations for Other Services         Precautions/Restrictions   Precautions Precautions: Fall Recall of Precautions/Restrictions: Intact Restrictions Weight Bearing Restrictions Per Provider Order: No     Mobility Bed Mobility Overal bed mobility: Independent             General bed  mobility comments: HOB flat, no use of AD    Transfers Overall transfer level: Independent Equipment used: None               General transfer comment: No assist needed      Balance Overall balance assessment: Modified Independent                                         ADL either performed or assessed with clinical judgement   ADL Overall ADL's : Independent                                       General ADL Comments: At baseline     Vision Baseline Vision/History: 1 Wears glasses Ability to See in Adequate Light: 0 Adequate Patient Visual Report: No change from baseline Vision Assessment?: No apparent visual deficits     Perception         Praxis         Pertinent Vitals/Pain Pain Assessment Pain Assessment: 0-10 Pain Score: 4  Pain Location: bottom/back Pain Descriptors / Indicators: Aching, Discomfort Pain Intervention(s): Limited activity within patient's tolerance, Monitored during session, Repositioned     Extremity/Trunk Assessment Upper Extremity Assessment Upper Extremity Assessment: Overall WFL for tasks assessed   Lower Extremity Assessment Lower Extremity Assessment: Defer to PT evaluation   Cervical /  Trunk Assessment Cervical / Trunk Assessment: Normal   Communication Communication Communication: No apparent difficulties   Cognition Arousal: Alert Behavior During Therapy: WFL for tasks assessed/performed Cognition: No apparent impairments                               Following commands: Intact       Cueing  General Comments   Cueing Techniques: Verbal cues;Visual cues  VSS on RA   Exercises     Shoulder Instructions      Home Living Family/patient expects to be discharged to:: Private residence Living Arrangements: Non-relatives/Friends Available Help at Discharge: Friend(s);Available 24 hours/day Type of Home: House Home Access: Stairs to enter [employer] of Steps: 4 Entrance Stairs-Rails: Right Home Layout: One level     Bathroom Shower/Tub: Chief Strategy Officer: Standard Bathroom Accessibility: Yes How Accessible: Accessible via walker Home Equipment: Shower seat   Additional Comments: Pt reporting he is staying with a friend right now, 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      Prior Functioning/Environment Prior Level of Function : Independent/Modified Independent             Mobility Comments: Reports as community ambulator without an AD. Still drives, reports no falls ADLs Comments: independent    OT Problem List: Impaired sensation   OT Treatment/Interventions:        OT Goals(Current goals can be found in the care plan section)   Acute Rehab OT Goals Patient Stated Goal: To go home OT Goal Formulation: With patient Time For Goal Achievement: 11/02/24 Potential to Achieve Goals: Good   OT Frequency:       Co-evaluation              AM-PAC OT 6 Clicks Daily Activity     Outcome Measure Help from another person eating meals?: None Help from another person taking care of personal grooming?: None Help from another person toileting, which includes using toliet, bedpan, or urinal?: None Help from another person bathing (including washing, rinsing, drying)?: None Help from another person to put on and taking off regular upper body clothing?: None Help from another person to put on and taking off regular lower body clothing?: None 6 Click Score: 24   End of Session Nurse Communication: Mobility status  Activity Tolerance: Patient tolerated treatment well Patient left: in bed;with call bell/phone within reach  OT Visit Diagnosis: Other abnormalities of gait and mobility (R26.89)                Time: 8984-8970 OT Time Calculation (min): 14 min Charges:  OT General Charges $OT Visit: 1 Visit OT Evaluation $OT Eval Moderate Complexity: 1  Mod  [name], OTR/L [facility]  [name] 11/02/2024, 1:06 PM

## 2024-11-02 NOTE — TOC Initial Note (Signed)
 Transition of Care [facility]) - Initial/Assessment Note    Patient Details  Name: Carlos Sullivan MRN: [id number] Date of Birth: [birth year]  Transition [facility]) CM/SW Contact:    Hoy DELENA Bigness, LCSW Phone Number: 11/02/2024, 2:31 PM  Clinical Narrative:                 CSW consulted for food/transportation needs. 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 Pt has no transportation and has been unable to get to appointments, pick up medications, or get to social services to apply for benefits. Pt has no source of income and receives no benefits. Pt has active health insurance from prior job in August. Pt has no PCP but, is agreeable to have an appointment made. PCP appointment scheduled at [facility] for 12/5 at 1:20pm. Resources for [facility] provide to pt.  Pt will need medications sent to [facility] pharmacy in [place] in order to have them delivered to his home.  CSW assisted pt in completing Medicaid application and began [employer] application for EBT benefits. Pt to complete application once he returns home.  ICM will continue to follow for further needs and discharge planning.    Expected Discharge Plan: Home/Self Care Barriers to Discharge: Continued Medical Work up   Patient Goals and CMS Choice Patient states their goals for this hospitalization and ongoing recovery are:: To be able to return home CMS Medicare.gov Compare Post Acute Care list provided to:: Patient Choice offered to / list presented to : Patient      Expected Discharge Plan and Services In-house Referral: Clinical Social Work, Nutrition, PCP / Management Consultant Discharge Planning Services: NA Post Acute Care Choice: NA Living arrangements for the past 2 months: Single Family Home                 DME Arranged: N/A DME Agency: NA                  Prior Living Arrangements/Services Living arrangements for the  past 2 months: Single Family Home Lives with:: Friends Patient language and need for interpreter reviewed:: Yes Do you feel safe going back to the place where you live?: Yes      Need for Family Participation in Patient Care: No (Comment) Care giver support system in place?: No (comment) Current home services:  (NA) Criminal Activity/Legal Involvement Pertinent to Current Situation/Hospitalization: No - Comment as needed  Activities of Daily Living   ADL Screening (condition at time of admission) Independently performs ADLs?: Yes (appropriate for developmental age) Is the patient deaf or have difficulty hearing?: No Does the patient have difficulty seeing, even when wearing glasses/contacts?: No Does the patient have difficulty concentrating, remembering, or making decisions?: No  Permission Sought/Granted   Permission granted to share information with : No              Emotional Assessment Appearance:: Appears stated age Attitude/Demeanor/Rapport: Engaged Affect (typically observed): Accepting, Pleasant Orientation: : Oriented to Self, Oriented to Place, Oriented to Situation, Oriented to  Time Alcohol / Substance Use: Not Applicable Psych Involvement: No (comment)  Admission diagnosis:  Pleurodynia [R07.81] Urinary retention [R33.9] Saddle anesthesia [R20.0] 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 [M54.42, M54.41, G89.29] Patient Active Problem List   Diagnosis Date Noted   DVT (deep venous thrombosis) (HCC) 09/09/2024   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  Acute deep vein thrombosis (DVT) of femoral vein of left lower extremity (HCC) 05/28/2024   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   PCP:  Patient, [name] Pharmacy:   [facility] [phone] - [place], [place] - [address] AT [facility] [address] [place] [address] Phone: [phone] Fax: [phone]     Social Drivers  of Health (SDOH) Social History: SDOH Screenings   Food Insecurity: No Food Insecurity (11/01/2024)  Housing: High Risk (11/01/2024)  Transportation Needs: Unmet Transportation Needs (11/01/2024)  Utilities: At Risk (11/01/2024)  Social Connections: Socially Isolated (11/01/2024)  Tobacco Use: High Risk (11/01/2024)   SDOH Interventions: Housing Interventions: Intervention Not Indicated, Inpatient TOC Transportation Interventions: Inpatient TOC, Community Resources Provided Utilities Interventions: Intervention Not Indicated, Inpatient TOC Social Connections Interventions: Inpatient TOC, Intervention Not Indicated   Readmission Risk Interventions    11/02/2024    2:16 PM 05/15/2024    9:57 AM  Readmission Risk Prevention Plan  Post Dischage Appt Complete Complete  Medication Screening Complete Complete  Transportation Screening Complete Complete

## 2024-11-02 NOTE — Evaluation (Signed)
 Physical Therapy Evaluation Patient Details Name: Carlos Sullivan MRN: [id number] DOB: 10-22-1971 Today's Date: 11/02/2024  History of Present Illness  Carlos Sullivan is a 53 y.o. male with medical history significant of hypertension, opioid use disorder, LLE DVT s/p thrombectomy and unclear history of intestinal mass in 2018 who presents to the emergency department due to back pain and difficulty in urination.  Patient endorsed chronic back pain with right-sided sciatica, but complained of few days onset of difficulty in urination in which he needed to bear down sometimes just to be able to obtain some dribbling of urine.  He complained of numbness from the waist down including groin and endorsed an episode of bowel incontinence today, he also complained of bilateral leg weakness and numbness with difficulty in ambulation.   Clinical Impression  Patient agreeable to PT evaluation this date. Patient reports at baseline, he is independent with all mobility, ambulation, and ADLs. On this date, patient is independent with bed mobility, donning shoes/socks indicating independence with lower body dressing at seated level, and with functional transfers. Patient is sup/mod independent with ambulation as he demonstrates some mild unsteadiness due to inc pain and some numbness/tingling in BLE. Pt reports this as new change. Light touch sensation equal on both sides but decreased overall. Patient demonstrate good general strength. At this time, Patient does not present with urgent need for skilled physical therapy acutely but may benefit from outpatient PT services once discharged. Patient discharged to care of nursing for ambulation daily as tolerated for length of stay.         If plan is discharge home, recommend the following: Assist for transportation   Can travel by private vehicle        Equipment Recommendations None recommended by PT  Recommendations for Other Services       Functional  Status Assessment Patient has had a recent decline in their functional status and demonstrates the ability to make significant improvements in function in a reasonable and predictable amount of time.     Precautions / Restrictions Precautions Precautions: Fall Recall of Precautions/Restrictions: Intact Restrictions Weight Bearing Restrictions Per Provider Order: No      Mobility  Bed Mobility Overal bed mobility: Independent       General bed mobility comments: HOB flat, no use of AD    Transfers Overall transfer level: Independent Equipment used: None     General transfer comment: STS from bed, recliner, and toilet without assist or use of railing/bars    Ambulation/Gait Ambulation/Gait assistance: Modified independent (Device/Increase time), Supervision Gait Distance (Feet): 100 Feet Assistive device: None Gait Pattern/deviations: Step-through pattern, Antalgic, Drifts right/left Gait velocity: Slightly decreased     General Gait Details: Pt ambulates in halls without an AD. Reports inc pain and is mildly unsteady at times, reporting N/T in LE/feet.  Stairs            Wheelchair Mobility     Tilt Bed    Modified Rankin (Stroke Patients Only)       Balance Overall balance assessment: Mild deficits observed, not formally tested           Pertinent Vitals/Pain Pain Assessment Pain Assessment: 0-10 Pain Score: 8  Pain Location: bottom/back Pain Intervention(s): RN gave pain meds during session, Limited activity within patient's tolerance, Repositioned    Home Living Family/patient expects to be discharged to:: Private residence Living Arrangements: Non-relatives/Friends Available Help at Discharge: Friend(s);Available 24 hours/day Type of Home: House Home Access: Stairs to enter Entrance  Stairs-Rails: Right Entrance Stairs-Number of Steps: 4   Home Layout: One level Home Equipment: Shower seat Additional Comments: Pt reporting he is  staying with a friend right now, 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    Prior Function Prior Level of Function : Independent/Modified Independent   Mobility Comments: Reports as community ambulator without an AD. Still drives, reports no falls ADLs Comments: independent     Extremity/Trunk Assessment   Upper Extremity Assessment Upper Extremity Assessment: Overall WFL for tasks assessed    Lower Extremity Assessment Lower Extremity Assessment: Overall WFL for tasks assessed (WNL, strength grossly 4+/5 throughout LE but with mild pain in low back/bottom area)    Cervical / Trunk Assessment Cervical / Trunk Assessment: Normal  Communication   Communication Communication: No apparent difficulties    Cognition Arousal: Alert Behavior During Therapy: WFL for tasks assessed/performed   PT - Cognitive impairments: No apparent impairments     Following commands: Intact       Cueing Cueing Techniques: Verbal cues, Visual cues     General Comments      Exercises     Assessment/Plan    PT Assessment All further PT needs can be met in the next venue of care  PT Problem List Decreased balance;Pain       PT Treatment Interventions      PT Goals (Current goals can be found in the Care Plan section)  Acute Rehab PT Goals Patient Stated Goal: Return home PT Goal Formulation: With patient Time For Goal Achievement: 11/05/24 Potential to Achieve Goals: Good    Frequency       Co-evaluation               AM-PAC PT 6 Clicks Mobility  Outcome Measure Help needed turning from your back to your side while in a flat bed without using bedrails?: None Help needed moving from lying on your back to sitting on the side of a flat bed without using bedrails?: None Help needed moving to and from a bed to a chair (including a wheelchair)?: None Help needed standing up from a chair using your arms (e.g., wheelchair or bedside chair)?:  None Help needed to walk in hospital room?: None Help needed climbing 3-5 steps with a railing? : None 6 Click Score: 24    End of Session   Activity Tolerance: Patient tolerated treatment well;Patient limited by pain Patient left: in chair;with call bell/phone within reach   PT Visit Diagnosis: Unsteadiness on feet (R26.81);Other symptoms and signs involving the nervous system (R29.898)    Time: 9098-9082 PT Time Calculation (min) (ACUTE ONLY): 16 min   Charges:   PT Evaluation $PT Eval Low Complexity: 1 Low   PT General Charges $$ ACUTE PT VISIT: 1 Visit         11:25 AM, 11/02/24 Rosaria Settler, PT, DPT [place] with [facility]

## 2024-11-02 NOTE — Progress Notes (Signed)
 PROGRESS NOTE    Carlos Sullivan  FMW:[id number] DOB: [birth year] DOA: 11/01/2024 PCP: Patient, [name]   Brief Narrative:    53 y.o. male with medical history significant of hypertension, opioid use disorder, LLE DVT s/p thrombectomy and unclear history of intestinal mass in 2018 who presents to the emergency department due to back pain and difficulty in urination.  Patient endorsed chronic back pain with right-sided sciatica, but complained of few days onset of difficulty in urination in which he needed to bear down sometimes just to be able to obtain some dribbling of urine.  He complained of numbness from the waist down including groin and endorsed an episode of bowel incontinence. [facility]surgery [facility]) was consulted and stated that no acute intervention was needed from their perspective at this point in time.  Oncology consulted. CT angiography chest with contrast showed no evidence of pulmonary embolus, no acute intrathoracic process MRI lumbar spine without contrast showed mildly increased size of a large (8.5 x 2.8 x 5.7 cm) mass along the dorsal aspect of the sacrum which fills the sacral canal and sacral foramina. Similar multilevel degenerative changes and foraminal stenosis  Assessment & Plan:  Principal Problem:   Sacral mass   Chronic bilateral low back pain with bilateral sciatica possibly due to sacral mass Continue IV Dilaudid 0.5 mg every 4 hours as needed for moderate/severe pain Added scheduled oxycodone  long-acting 10 mg every 12 hours Continue fall precautions Continue PT/OT eval and treat Oncology will be consulted and we shall await further recommendation   Urinary retention possibly due to above Continue In-N-Out catheterization for now  Continue Flomax Patient may eventually require Foley catheterization if urinary retention persists   Presumed saddle anesthesia Likely secondary to underlying sacral mass/tumor Patient complained of symptoms similar to  saddle anesthesia which appeared to have started in less than 24 hours ago Neurosurgery was consulted and there was no indication for any surgical intervention at this time per ED PA To general surgery and there is no general surgical need on this patient Pending oncology evaluation   Essential hypertension Continue IV hydralazine  10 mg every 6 hours as needed  Disposition: Patient lives at home with his friend.  He is independent in activities of daily life.    DVT prophylaxis: SCDs Start: 11/01/24 2301     Code Status: Full Code Family Communication: None at the bedside Status is: Inpatient Remains inpatient appropriate because: Back pain, malignancy    Subjective:  He is complaining of back pain.  He said that he used to be on Suboxone  until 1 month back but then he lost his insurance so he has not been able to get his Suboxone .  Lives at home with his friend.  I told him that I will talk to the case manager/social worker regarding the Suboxone  as an insurance issues.  Examination:  General exam: Appears to be in mild distress Respiratory system: Clear to auscultation. Respiratory effort normal. Cardiovascular system: S1 & S2 heard, RRR. No JVD, murmurs, rubs, gallops or clicks. No pedal edema. Gastrointestinal system: Abdomen is nondistended, soft and nontender. No organomegaly or masses felt. Normal bowel sounds heard. Central nervous system: Alert and oriented.  Decreased sensations of the bilateral lower extremities Extremities: Symmetric 5 x 5 power. Skin: No rashes, lesions or ulcers Psychiatry: Judgement and insight appear normal. Mood & affect appropriate.       Diet Orders (From admission, onward)     Start     Ordered  11/03/24 0001  Diet NPO time specified  Diet effective midnight        11/02/24 0157   11/01/24 2302  Diet Heart Room service appropriate? Yes; Fluid consistency: Thin  Diet effective now       Question Answer Comment  Room service  appropriate? Yes   Fluid consistency: Thin      11/01/24 2302            Objective: Vitals:   11/01/24 2045 11/01/24 2300 11/01/24 2301 11/02/24 0517  BP: (!) 143/92 (!) 157/95  (!) 132/90  Pulse: 73 95  62  Resp: 20   18  Temp:  98.3 F (36.8 C)  98.5 F (36.9 C)  TempSrc:  Oral  Oral  SpO2: 99% 99% 99% (!) 9%  Weight:  88 kg    Height:        Intake/Output Summary (Last 24 hours) at 11/02/2024 0935 Last data filed at 11/01/2024 2129 Gross per 24 hour  Intake --  Output 300 ml  Net -300 ml   Filed Weights   11/01/24 1509 11/01/24 2300  Weight: 90.7 kg 88 kg    Scheduled Meds:  tamsulosin  0.4 mg Oral Daily   Continuous Infusions:  Nutritional status     Body mass index is 24.25 kg/m.  Data Reviewed:   CBC: Recent Labs  Lab 11/01/24 1602 11/02/24 0426  WBC 5.3 6.1  NEUTROABS 3.2  --   HGB 11.9* 10.1*  HCT 37.8* 32.0*  MCV 85.5 84.4  PLT 158 131*   Basic Metabolic Panel: Recent Labs  Lab 11/01/24 1602 11/02/24 0426  NA 141 142  K 3.6 3.3*  CL 103 107  CO2 28 29  GLUCOSE 131* 80  BUN 10 10  CREATININE 0.80 0.80  CALCIUM 9.2 8.5*  MG  --  2.2  PHOS  --  4.0   GFR: Estimated Creatinine Clearance: 130.6 mL/min (by C-G formula based on SCr of 0.8 mg/dL). Liver Function Tests: Recent Labs  Lab 11/01/24 1602 11/02/24 0426  AST 16 12*  ALT 7 5  ALKPHOS 80 66  BILITOT 0.6 0.4  PROT 7.5 6.2*  ALBUMIN 4.2 3.6   No results for input(s): LIPASE, AMYLASE in the last 168 hours. No results for input(s): AMMONIA in the last 168 hours. Coagulation Profile: No results for input(s): INR, PROTIME in the last 168 hours. Cardiac Enzymes: No results for input(s): CKTOTAL, CKMB, CKMBINDEX, TROPONINI in the last 168 hours. BNP (last 3 results) No results for input(s): PROBNP in the last 8760 hours. HbA1C: No results for input(s): HGBA1C in the last 72 hours. CBG: No results for input(s): GLUCAP in the last 168  hours. Lipid Profile: No results for input(s): CHOL, HDL, LDLCALC, TRIG, CHOLHDL, LDLDIRECT in the last 72 hours. Thyroid  Function Tests: No results for input(s): TSH, T4TOTAL, FREET4, T3FREE, THYROIDAB in the last 72 hours. Anemia Panel: No results for input(s): VITAMINB12, FOLATE, FERRITIN, TIBC, IRON, RETICCTPCT in the last 72 hours. Sepsis Labs: No results for input(s): PROCALCITON, LATICACIDVEN in the last 168 hours.  No results found for this or any previous visit (from the past 240 hours).       Radiology Studies: CT Angio Chest PE W and/or Wo Contrast Result Date: 11/01/2024 CLINICAL DATA:  History of spinal neuroendocrine tumor, prior DVT, concern for pulmonary embolus EXAM: CT ANGIOGRAPHY CHEST WITH CONTRAST TECHNIQUE: Multidetector CT imaging of the chest was performed using the standard protocol during bolus administration of intravenous contrast. Multiplanar CT  image reconstructions and MIPs were obtained to evaluate the vascular anatomy. RADIATION DOSE REDUCTION: This exam was performed according to the departmental dose-optimization program which includes automated exposure control, adjustment of the mA and/or kV according to patient size and/or use of iterative reconstruction technique. CONTRAST:  75mL OMNIPAQUE  IOHEXOL  350 MG/ML SOLN COMPARISON:  05/13/2024, 06/02/2024 FINDINGS: Cardiovascular: This is a technically adequate evaluation of the pulmonary vasculature. No filling defects or pulmonary emboli. The heart is unremarkable without pericardial effusion. Normal caliber of the thoracic aorta. Mediastinum/Nodes: No enlarged mediastinal, hilar, or axillary lymph nodes. Thyroid  gland, trachea, and esophagus demonstrate no significant findings. Lungs/Pleura: No acute airspace disease, effusion, or pneumothorax. Central airways are widely patent. Upper Abdomen: No acute abnormality. Musculoskeletal: There are no acute displaced fractures. The  known metastatic lesion within the right T12 pedicle is not readily apparent by CT. Reconstructed images demonstrate no additional findings. Review of the MIP images confirms the above findings. IMPRESSION: 1. No evidence of pulmonary embolus. 2. No acute intrathoracic process. 3. The known T12 metastatic lesion seen on prior PET scan is not readily apparent by CT. Electronically Signed   By: Ozell Daring M.D.   On: 11/01/2024 21:40   MR LUMBAR SPINE WO CONTRAST Result Date: 11/01/2024 EXAM: MRI Lumbar Spine 11/01/2024 06:49:17 PM TECHNIQUE: Multiplanar multisequence MRI of the lumbar spine was performed without the administration of intravenous contrast. COMPARISON: MRI lumbar spine May 12, 2024 CLINICAL HISTORY: Low back pain, cauda equina syndrome suspected FINDINGS: Segmentation: Same numbering as on the prior with partially lumbarized S1 vertebral body. BONES AND ALIGNMENT: Mildly decreased conspicuity of STIR hyperintense T12 and L5 vertebral body lesions which are likely similar in size. The absence of contrast precludes evaluation for enhancing lesions. Grade 1 anterolisthesis of L5 on S1. SPINAL CORD: The conus terminates normally. SOFT TISSUES: Similar versus mildly increased size of a large (8.5 x 2.8 x 5.7 cm) mass along the dorsal aspect of the sacrum which fills the sacral canal and sacral foramina. L1-L2: No significant disc herniation. No spinal canal stenosis or neural foraminal narrowing. L2-L3: Arthropathy and ligamentum flavum thickening. Mild right subarticular recess stenosis and bilateral foraminal stenosis. No significant canal stenosis. L3-L4: Disc bulge and bilateral facet arthropathy. Similar moderate right subarticular recess stenosis and moderate right greater than left foraminal stenosis. Patent central canal. L4-L5: Right disc bulge with ligamentum flavum thickening. Bilateral facet arthropathy. Similar mild canal stenosis. Similar mild foraminal stenosis. L5-S1: Grade 1  anterolisthesis and uncovering of the disc. The sacral mass described above fills the canal. Similar moderate bilateral foraminal stenosis. S1-S2:  The sacral mass described above fills the canal. IMPRESSION: 1. Mildly decreased conspicuity of STIR hyperintense T12 and L5 vertebral body lesions which are likely similar in size. 2. Similar versus mildly increased size of a large (8.5 x 2.8 x 5.7 cm) mass along the dorsal aspect of the sacrum which fills the sacral canal and sacral foramina, partially imaged. 3. Similar multilevel degenerative changes and foraminal stenosis, as detailed above. Electronically signed by: Gilmore Molt MD 11/01/2024 08:45 PM EST RP Workstation: [id number]           LOS: 1 day   Time spent= 36 mins    Deliliah Room, MD [facility]  If 7PM-7AM, please contact night-coverage  11/02/2024, 9:35 AM

## 2024-11-03 DIAGNOSIS — M533 Sacrococcygeal disorders, not elsewhere classified: Secondary | ICD-10-CM | POA: Diagnosis not present

## 2024-11-03 MED ORDER — POLYVINYL ALCOHOL 1.4 % OP SOLN
1.0000 [drp] | OPHTHALMIC | Status: DC | PRN
  Administered 2024-11-03: 1 [drp] via OPHTHALMIC
  Filled 2024-11-03: qty 15

## 2024-11-03 NOTE — Plan of Care (Signed)
°  Problem: Education: °Goal: Knowledge of General Education information will improve °Description: Including pain rating scale, medication(s)/side effects and non-pharmacologic comfort measures °Outcome: Progressing °  °Problem: Clinical Measurements: °Goal: Ability to maintain clinical measurements within normal limits will improve °Outcome: Progressing °Goal: Will remain free from infection °Outcome: Progressing °Goal: Diagnostic test results will improve °Outcome: Progressing °Goal: Respiratory complications will improve °Outcome: Progressing °Goal: Cardiovascular complication will be avoided °Outcome: Progressing °  °Problem: Activity: °Goal: Risk for activity intolerance will decrease °Outcome: Progressing °  °Problem: Coping: °Goal: Level of anxiety will decrease °Outcome: Progressing °  °Problem: Elimination: °Goal: Will not experience complications related to bowel motility °Outcome: Progressing °Goal: Will not experience complications related to urinary retention °Outcome: Progressing °  °Problem: Safety: °Goal: Ability to remain free from injury will improve °Outcome: Progressing °  °Problem: Skin Integrity: °Goal: Risk for impaired skin integrity will decrease °Outcome: Progressing °  °

## 2024-11-03 NOTE — Progress Notes (Addendum)
 PROGRESS NOTE    Carlos Sullivan  FMW:[id number] DOB: 03-18-1971 DOA: 11/01/2024 PCP: Patient, [name]   Brief Narrative:    53 y.o. male with medical history significant of hypertension, opioid use disorder, LLE DVT s/p thrombectomy and unclear history of intestinal mass in 2018 who presents to the emergency department due to back pain and difficulty in urination.  Patient endorsed chronic back pain with right-sided sciatica, but complained of few days onset of difficulty in urination in which he needed to bear down sometimes just to be able to obtain some dribbling of urine.  He complained of numbness from the waist down including groin and endorsed an episode of bowel incontinence. [facility]surgery [facility]) was consulted and stated that no acute intervention was needed from their perspective at this point in time.  Oncology consulted. CT angiography chest with contrast showed no evidence of pulmonary embolus, no acute intrathoracic process MRI lumbar spine without contrast showed mildly increased size of a large (8.5 x 2.8 x 5.7 cm) mass along the dorsal aspect of the sacrum which fills the sacral canal and sacral foramina. Similar multilevel degenerative changes and foraminal stenosis  Assessment & Plan:  Principal Problem:   Sacral mass Active Problems:   Protein-calorie malnutrition, severe   Chronic bilateral low back pain with bilateral sciatica possibly due to sacral mass Continue IV Dilaudid 0.5 mg every 4 hours as needed for moderate/severe pain Added scheduled oxycodone  long-acting 10 mg every 12 hours Continue fall precautions Continue PT/OT eval and treat   Urinary retention possibly due to above Continue In-N-Out catheterization for now  Continue Flomax Patient may eventually require Foley catheterization if urinary retention persists   Presumed saddle anesthesia Likely secondary to underlying sacral mass/tumor Patient complained of symptoms similar to saddle  anesthesia which appeared to have started in less than 24 hours ago Neurosurgery was consulted and there was no indication for any surgical intervention at this time per ED PA To general surgery and there is no general surgical need on this patient Pending oncology evaluation Spoke to Dr Autumn as well as Dr Davonna and patient will be seen today by oncology.  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 I last saw him in June. He was living in a halfway house at that time and had a lot of social issues and could not come to the appointments as scheduled and he has been since lost to follow-up. Though it was well-differentiated, because of the tumor burden, after discussion in GI tumor conference, plan made to proceed with octreotide injections and radiation treatments.   Essential hypertension Continue IV hydralazine  10 mg every 6 hours as needed  Disposition: Patient lives at home with his friend.  He is independent in activities of daily life.    DVT prophylaxis: SCDs Start: 11/01/24 2301     Code Status: Full Code Family Communication: None at the bedside Status is: Inpatient Remains inpatient appropriate because: Back pain, malignancy    Subjective:  Case management spoke to him about medicaid application. I spoke to him about contacting Dr Autumn about his cancer. He is able to walk but still has b/l LE numbness.  Examination:  General exam: Appears calm and comfortable Respiratory system: Clear to auscultation. Respiratory effort normal. Cardiovascular system: S1 & S2 heard, RRR. No JVD, murmurs, rubs, gallops or clicks. No  pedal edema. Gastrointestinal system: Abdomen is nondistended, soft and nontender. No organomegaly or masses felt. Normal bowel sounds heard. Central nervous system: Alert  and oriented.  Decreased sensations of the bilateral lower extremities Extremities: Symmetric 5 x 5 power. Skin: No rashes, lesions or ulcers Psychiatry: Judgement and insight appear normal. Mood & affect appropriate.       Diet Orders (From admission, onward)     Start     Ordered   11/03/24 0001  Diet NPO time specified  Diet effective midnight        11/02/24 0157            Objective: Vitals:   11/02/24 0517 11/02/24 1411 11/02/24 2227 11/03/24 0427  BP: (!) 132/90 130/84 139/84 124/81  Pulse: 62 91 87 82  Resp: 18 20 20 18   Temp: 98.5 F (36.9 C) 97.9 F (36.6 C) 98 F (36.7 C) 97.7 F (36.5 C)  TempSrc: Oral Oral Oral Oral  SpO2: (!) 9% 97% 99% 97%  Weight:      Height:        Intake/Output Summary (Last 24 hours) at 11/03/2024 1055 Last data filed at 11/02/2024 2100 Gross per 24 hour  Intake 880 ml  Output 1800 ml  Net -920 ml   Filed Weights   11/01/24 1509 11/01/24 2300  Weight: 90.7 kg 88 kg    Scheduled Meds:  feeding supplement  237 mL Oral TID BM   multivitamin with minerals  1 tablet Oral Daily   oxyCODONE   10 mg Oral Q12H   tamsulosin  0.4 mg Oral Daily   Continuous Infusions:  Nutritional status Signs/Symptoms: severe muscle depletion, severe fat depletion, percent weight loss (11% weight loss x 6 months) Percent weight loss: 11 % Interventions: Ensure Enlive (each supplement provides 350kcal and 20 grams of protein), MVI, Liberalize Diet Body mass index is 24.25 kg/m.  Data Reviewed:   CBC: Recent Labs  Lab 11/01/24 1602 11/02/24 0426  WBC 5.3 6.1  NEUTROABS 3.2  --   HGB 11.9* 10.1*  HCT 37.8* 32.0*  MCV 85.5 84.4  PLT 158 131*   Basic Metabolic Panel: Recent Labs  Lab 11/01/24 1602 11/02/24 0426  NA 141 142  K 3.6 3.3*  CL 103 107  CO2 28 29  GLUCOSE 131* 80  BUN 10 10  CREATININE 0.80 0.80  CALCIUM 9.2 8.5*  MG  --  2.2  PHOS  --  4.0   GFR: Estimated Creatinine Clearance: 130.6 mL/min (by C-G formula  based on SCr of 0.8 mg/dL). Liver Function Tests: Recent Labs  Lab 11/01/24 1602 11/02/24 0426  AST 16 12*  ALT 7 5  ALKPHOS 80 66  BILITOT 0.6 0.4  PROT 7.5 6.2*  ALBUMIN 4.2 3.6   No results for input(s): LIPASE, AMYLASE in the last 168 hours. No results for input(s): AMMONIA in the last 168 hours. Coagulation Profile: No results for input(s): INR, PROTIME in the last 168 hours. Cardiac Enzymes: No results for input(s): CKTOTAL, CKMB, CKMBINDEX, TROPONINI in the last 168 hours. BNP (last 3 results) No results for input(s): PROBNP in the last 8760 hours. HbA1C: No results for input(s): HGBA1C in the last 72 hours. CBG: No results for input(s): GLUCAP in the last 168 hours. Lipid Profile: No results for input(s): CHOL, HDL, LDLCALC, TRIG, CHOLHDL, LDLDIRECT in the last 72 hours. Thyroid  Function Tests: No results for input(s): TSH, T4TOTAL, FREET4, T3FREE, THYROIDAB in the last 72 hours. Anemia Panel: No results for input(s): VITAMINB12,  FOLATE, FERRITIN, TIBC, IRON, RETICCTPCT in the last 72 hours. Sepsis Labs: No results for input(s): PROCALCITON, LATICACIDVEN in the last 168 hours.  No results found for this or any previous visit (from the past 240 hours).       Radiology Studies: CT Angio Chest PE W and/or Wo Contrast Result Date: 11/01/2024 CLINICAL DATA:  History of spinal neuroendocrine tumor, prior DVT, concern for pulmonary embolus EXAM: CT ANGIOGRAPHY CHEST WITH CONTRAST TECHNIQUE: Multidetector CT imaging of the chest was performed using the standard protocol during bolus administration of intravenous contrast. Multiplanar CT image reconstructions and MIPs were obtained to evaluate the vascular anatomy. RADIATION DOSE REDUCTION: This exam was performed according to the departmental dose-optimization program which includes automated exposure control, adjustment of the mA and/or kV according to patient  size and/or use of iterative reconstruction technique. CONTRAST:  75mL OMNIPAQUE  IOHEXOL  350 MG/ML SOLN COMPARISON:  05/13/2024, 06/02/2024 FINDINGS: Cardiovascular: This is a technically adequate evaluation of the pulmonary vasculature. No filling defects or pulmonary emboli. The heart is unremarkable without pericardial effusion. Normal caliber of the thoracic aorta. Mediastinum/Nodes: No enlarged mediastinal, hilar, or axillary lymph nodes. Thyroid  gland, trachea, and esophagus demonstrate no significant findings. Lungs/Pleura: No acute airspace disease, effusion, or pneumothorax. Central airways are widely patent. Upper Abdomen: No acute abnormality. Musculoskeletal: There are no acute displaced fractures. The known metastatic lesion within the right T12 pedicle is not readily apparent by CT. Reconstructed images demonstrate no additional findings. Review of the MIP images confirms the above findings. IMPRESSION: 1. No evidence of pulmonary embolus. 2. No acute intrathoracic process. 3. The known T12 metastatic lesion seen on prior PET scan is not readily apparent by CT. Electronically Signed   By: Ozell Daring M.D.   On: 11/01/2024 21:40   MR LUMBAR SPINE WO CONTRAST Result Date: 11/01/2024 EXAM: MRI Lumbar Spine 11/01/2024 06:49:17 PM TECHNIQUE: Multiplanar multisequence MRI of the lumbar spine was performed without the administration of intravenous contrast. COMPARISON: MRI lumbar spine May 12, 2024 CLINICAL HISTORY: Low back pain, cauda equina syndrome suspected FINDINGS: Segmentation: Same numbering as on the prior with partially lumbarized S1 vertebral body. BONES AND ALIGNMENT: Mildly decreased conspicuity of STIR hyperintense T12 and L5 vertebral body lesions which are likely similar in size. The absence of contrast precludes evaluation for enhancing lesions. Grade 1 anterolisthesis of L5 on S1. SPINAL CORD: The conus terminates normally. SOFT TISSUES: Similar versus mildly increased size of a large  (8.5 x 2.8 x 5.7 cm) mass along the dorsal aspect of the sacrum which fills the sacral canal and sacral foramina. L1-L2: No significant disc herniation. No spinal canal stenosis or neural foraminal narrowing. L2-L3: Arthropathy and ligamentum flavum thickening. Mild right subarticular recess stenosis and bilateral foraminal stenosis. No significant canal stenosis. L3-L4: Disc bulge and bilateral facet arthropathy. Similar moderate right subarticular recess stenosis and moderate right greater than left foraminal stenosis. Patent central canal. L4-L5: Right disc bulge with ligamentum flavum thickening. Bilateral facet arthropathy. Similar mild canal stenosis. Similar mild foraminal stenosis. L5-S1: Grade 1 anterolisthesis and uncovering of the disc. The sacral mass described above fills the canal. Similar moderate bilateral foraminal stenosis. S1-S2:  The sacral mass described above fills the canal. IMPRESSION: 1. Mildly decreased conspicuity of STIR hyperintense T12 and L5 vertebral body lesions which are likely similar in size. 2. Similar versus mildly increased size of a large (8.5 x 2.8 x 5.7 cm) mass along the dorsal aspect of the sacrum which fills the sacral canal and sacral foramina,  partially imaged. 3. Similar multilevel degenerative changes and foraminal stenosis, as detailed above. Electronically signed by: Gilmore Molt MD 11/01/2024 08:45 PM EST RP Workstation: [id number]           LOS: 2 days   Time spent= 36 mins    Deliliah Room, MD [facility]  If 7PM-7AM, please contact night-coverage  11/03/2024, 10:55 AM

## 2024-11-03 NOTE — Plan of Care (Signed)

## 2024-11-03 NOTE — Consult Note (Signed)
 [facility] Consultation Hematology/Oncology  CONSULTING PHYSICIAN: Dr. 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  He was seen by Dr. Autumn in June 2025 but unfortunately was lost to follow-up due to lack of transportation.  Though it was well-differentiated because of the tumor burden after discussion in GI tumor conference plan made to proceed with octreotide injections and radiation treatments.  He recently presented to the emergency room with lower extremity numbness without weakness with issues of urinary retention.  Neurosurgery does not think he needs any intervention at this time.  Patient reports today that he is doing well except for pain in his lower back.  Having trouble bearing weight but overall is feeling okay.  Reports he is interested in starting treatments for his cancer but has trouble getting back and forth due to transportation.  He is not living in [place] and does not have reliable transportation at this time.  MEDICATIONS: I have reviewed the patient's current medications.     PERFORMANCE STATUS: The patient's performance status is 1 - Symptomatic but completely ambulatory  PHYSICAL EXAM: Most Recent Vital Signs: Blood pressure (!) 134/103, pulse 92, temperature 97.8 F (36.6 C), temperature source Oral, resp. rate 12, height 6' (1.829 m), weight 270 lb (122.5 kg), SpO2 91%.  Physical Exam Constitutional:      Appearance: Normal appearance.  Cardiovascular:     Rate and Rhythm: Normal rate and regular rhythm.  Pulmonary:     Effort: Pulmonary effort is normal.     Breath sounds: Normal breath sounds.  Abdominal:     General:  Bowel sounds are normal.     Palpations: Abdomen is soft.  Musculoskeletal:        General: No swelling. Normal range of motion.     Lumbar back: Tenderness and bony tenderness present.  Neurological:     Mental Status: He is alert and oriented to person, place, and time. Mental status is at baseline.        LABORATORY DATA:   Last CBC Lab Results  Component Value Date   WBC 6.1 11/02/2024   HGB 10.1 (L) 11/02/2024   HCT 32.0 (L) 11/02/2024   MCV 84.4 11/02/2024   MCH 26.6 11/02/2024   RDW 17.3 (H) 11/02/2024   PLT 131 (L) 11/02/2024     Last metabolic panel Lab Results  Component Value Date   GLUCOSE 80 11/02/2024   NA 142 11/02/2024   K 3.3 (L) 11/02/2024   CL 107 11/02/2024   CO2 29 11/02/2024   BUN 10 11/02/2024   CREATININE 0.80 11/02/2024   GFRNONAA >60 11/02/2024   CALCIUM 8.5 (L) 11/02/2024   PHOS 4.0 11/02/2024   PROT 6.2 (L) 11/02/2024   ALBUMIN 3.6 11/02/2024   LABGLOB 2.9 05/15/2024   BILITOT 0.4 11/02/2024   ALKPHOS 66 11/02/2024   AST 12 (L) 11/02/2024   ALT 5 11/02/2024   ANIONGAP 7 11/02/2024      RADIOGRAPHY: CT Angio Chest PE W and/or Wo Contrast CLINICAL DATA:  History of spinal neuroendocrine tumor, prior DVT, concern for pulmonary embolus  EXAM: CT ANGIOGRAPHY CHEST WITH CONTRAST  TECHNIQUE: Multidetector CT imaging of the chest was performed using the standard protocol during  bolus administration of intravenous contrast. Multiplanar CT image reconstructions and MIPs were obtained to evaluate the vascular anatomy.  RADIATION DOSE REDUCTION: This exam was performed according to the departmental dose-optimization program which includes automated exposure control, adjustment of the mA and/or kV according to patient size and/or use of iterative reconstruction technique.  CONTRAST:  75mL OMNIPAQUE  IOHEXOL  350 MG/ML SOLN  COMPARISON:  05/13/2024, 06/02/2024  FINDINGS: Cardiovascular: This is a technically adequate evaluation of  the pulmonary vasculature. No filling defects or pulmonary emboli.  The heart is unremarkable without pericardial effusion. Normal caliber of the thoracic aorta.  Mediastinum/Nodes: No enlarged mediastinal, hilar, or axillary lymph nodes. Thyroid  gland, trachea, and esophagus demonstrate no significant findings.  Lungs/Pleura: No acute airspace disease, effusion, or pneumothorax. Central airways are widely patent.  Upper Abdomen: No acute abnormality.  Musculoskeletal: There are no acute displaced fractures. The known metastatic lesion within the right T12 pedicle is not readily apparent by CT. Reconstructed images demonstrate no additional findings.  Review of the MIP images confirms the above findings.  IMPRESSION: 1. No evidence of pulmonary embolus. 2. No acute intrathoracic process. 3. The known T12 metastatic lesion seen on prior PET scan is not readily apparent by CT.  Electronically Signed   By: Ozell Daring M.D.   On: 11/01/2024 21:40 MR LUMBAR SPINE WO CONTRAST EXAM: MRI Lumbar Spine 11/01/2024 06:49:17 PM  TECHNIQUE: Multiplanar multisequence MRI of the lumbar spine was performed without the administration of intravenous contrast.  COMPARISON: MRI lumbar spine May 12, 2024  CLINICAL HISTORY: Low back pain, cauda equina syndrome suspected  FINDINGS:  Segmentation: Same numbering as on the prior with partially lumbarized S1 vertebral body.  BONES AND ALIGNMENT: Mildly decreased conspicuity of STIR hyperintense T12 and L5 vertebral body lesions which are likely similar in size. The absence of contrast precludes evaluation for enhancing lesions. Grade 1 anterolisthesis of L5 on S1.  SPINAL CORD: The conus terminates normally.  SOFT TISSUES: Similar versus mildly increased size of a large (8.5 x 2.8 x 5.7 cm) mass along the dorsal aspect of the sacrum which fills the sacral canal and sacral foramina.  L1-L2: No significant disc herniation. No  spinal canal stenosis or neural foraminal narrowing.  L2-L3: Arthropathy and ligamentum flavum thickening. Mild right subarticular recess stenosis and bilateral foraminal stenosis. No significant canal stenosis.  L3-L4: Disc bulge and bilateral facet arthropathy. Similar moderate right subarticular recess stenosis and moderate right greater than left foraminal stenosis. Patent central canal.  L4-L5: Right disc bulge with ligamentum flavum thickening. Bilateral facet arthropathy. Similar mild canal stenosis. Similar mild foraminal stenosis.  L5-S1: Grade 1 anterolisthesis and uncovering of the disc. The sacral mass described above fills the canal. Similar moderate bilateral foraminal stenosis.  S1-S2:  The sacral mass described above fills the canal.  IMPRESSION: 1. Mildly decreased conspicuity of STIR hyperintense T12 and L5 vertebral body lesions which are likely similar in size. 2. Similar versus mildly increased size of a large (8.5 x 2.8 x 5.7 cm) mass along the dorsal aspect of the sacrum which fills the sacral canal and sacral foramina, partially imaged. 3. Similar multilevel degenerative changes and foraminal stenosis, as detailed above.  Electronically signed by: Gilmore Molt MD 11/01/2024 08:45 PM EST RP Workstation: [id number]      ASSESSMENT: Patient is a 53 year old male with well-differentiated neuroendocrine tumor of small intestine.  PLAN:  Pain control while in the hospital. Follow-up at [facility] to discuss treatment options. Discharge when stable. Will contact Devere Manna,  social work to potentially help with transportation.   Thank you for involving us  in this patient's care.  Please to reach out with any questions or concerns.  Delon Hope, NP 11/03/2024 4:20 PM  Hematology/Oncology [facility]

## 2024-11-04 DIAGNOSIS — M533 Sacrococcygeal disorders, not elsewhere classified: Secondary | ICD-10-CM | POA: Diagnosis not present

## 2024-11-04 MED ORDER — NAPHAZOLINE-PHENIRAMINE 0.025-0.3 % OP SOLN
1.0000 [drp] | Freq: Four times a day (QID) | OPHTHALMIC | Status: DC | PRN
Start: 2024-11-04 — End: 2024-11-05
  Administered 2024-11-05: 1 [drp] via OPHTHALMIC
  Filled 2024-11-04: qty 5

## 2024-11-04 MED ORDER — GABAPENTIN 100 MG PO CAPS
200.0000 mg | ORAL_CAPSULE | Freq: Two times a day (BID) | ORAL | Status: DC
  Administered 2024-11-04 – 2024-11-05 (×3): 200 mg via ORAL
  Filled 2024-11-04 (×3): qty 2

## 2024-11-04 NOTE — Progress Notes (Signed)
 PROGRESS NOTE    Carlos Sullivan  FMW:[id number] DOB: 10/07/1971 DOA: 11/01/2024 PCP: Patient, [name]   Brief Narrative:    53 y.o. male with medical history significant of hypertension, opioid use disorder, LLE DVT s/p thrombectomy and unclear history of intestinal mass in 2018 who presents to the emergency department due to back pain and difficulty in urination.  Patient endorsed chronic back pain with right-sided sciatica, but complained of few days onset of difficulty in urination in which he needed to bear down sometimes just to be able to obtain some dribbling of urine.  He complained of numbness from the waist down including groin and endorsed an episode of bowel incontinence. Neurosurgery [facility]) was consulted and stated that no acute intervention was needed from their perspective at this point in time.  Oncology consulted. CT angiography chest with contrast showed no evidence of pulmonary embolus, no acute intrathoracic process MRI lumbar spine without contrast showed mildly increased size of a large (8.5 x 2.8 x 5.7 cm) mass along the dorsal aspect of the sacrum which fills the sacral canal and sacral foramina. Similar multilevel degenerative changes and foraminal stenosis  Assessment & Plan:  Principal Problem:   Sacral mass Active Problems:   Protein-calorie malnutrition, severe   Chronic bilateral low back pain with bilateral sciatica possibly due to sacral mass Continue IV Dilaudid 0.5 mg every 4 hours as needed for moderate/severe pain Added scheduled oxycodone  long-acting 10 mg every 12 hours Continue fall precautions Continue PT/OT eval and treat Started on gabapentin 200 mg PO BID on 11/6 for b/l LE neuropathy symptoms.   Urinary retention possibly due to above Continue In-N-Out catheterization for now  Continue Flomax Patient may eventually require Foley catheterization if urinary retention persists   Presumed saddle anesthesia Likely secondary to  underlying sacral mass/tumor Patient complained of symptoms similar to saddle anesthesia which appeared to have started in less than 24 hours ago Neurosurgery was consulted and there was no indication for any surgical intervention at this time per ED PA To general surgery and there is no general surgical need on this patient Pending oncology evaluation Spoke to Dr Autumn as well as Dr Davonna on 11/5.  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 I last saw him in June. He was living in a halfway house at that time and had a lot of social issues and could not come to the appointments as scheduled and he has been since lost to follow-up. Though it was well-differentiated, because of the tumor burden, after discussion in GI tumor conference, plan made to proceed with octreotide injections and radiation treatments.   Essential hypertension Continue IV hydralazine  10 mg every 6 hours as needed  Disposition: Patient lives at home with his friend.  He is independent in activities of daily life.    DVT prophylaxis: SCDs Start: 11/01/24 2301     Code Status: Full Code Family Communication: None at the bedside Status is: Inpatient Remains inpatient appropriate because: Back pain, malignancy    Subjective:  No acute events overnight, he was complaining of itchy eyes. He was seen by oncology yesterday and oncology case worker will arrange transportation. He will be seen here at oncology clinic. We also spoke about his suboxone  prescription on discharge.  Examination:  General exam: Appears calm and comfortable Respiratory system: Clear to auscultation.  Respiratory effort normal. Cardiovascular system: S1 & S2 heard, RRR. No JVD, murmurs, rubs, gallops or clicks. No pedal edema. Gastrointestinal system:  Abdomen is nondistended, soft and nontender. No organomegaly or masses felt. Normal bowel sounds heard. Central nervous system: Alert and oriented.  Decreased sensations of the bilateral lower extremities Extremities: Symmetric 5 x 5 power. Skin: No rashes, lesions or ulcers Psychiatry: Judgement and insight appear normal. Mood & affect appropriate.       Diet Orders (From admission, onward)     Start     Ordered   11/03/24 1521  Diet regular Room service appropriate? Yes; Fluid consistency: Thin  Diet effective now       Question Answer Comment  Room service appropriate? Yes   Fluid consistency: Thin      11/03/24 1520            Objective: Vitals:   11/03/24 0427 11/03/24 1307 11/03/24 1945 11/04/24 0436  BP: 124/81 131/82 120/88 116/75  Pulse: 82 83 93 67  Resp: 18 18 18 18   Temp: 97.7 F (36.5 C) 98.5 F (36.9 C) 98 F (36.7 C) 97.6 F (36.4 C)  TempSrc: Oral Oral Oral Oral  SpO2: 97% 96% 96% 97%  Weight:      Height:        Intake/Output Summary (Last 24 hours) at 11/04/2024 0933 Last data filed at 11/03/2024 1840 Gross per 24 hour  Intake 240 ml  Output 1000 ml  Net -760 ml   Filed Weights   11/01/24 1509 11/01/24 2300  Weight: 90.7 kg 88 kg    Scheduled Meds:  feeding supplement  237 mL Oral TID BM   multivitamin with minerals  1 tablet Oral Daily   oxyCODONE   10 mg Oral Q12H   tamsulosin  0.4 mg Oral Daily   Continuous Infusions:  Nutritional status Signs/Symptoms: severe muscle depletion, severe fat depletion, percent weight loss (11% weight loss x 6 months) Percent weight loss: 11 % Interventions: Ensure Enlive (each supplement provides 350kcal and 20 grams of protein), MVI, Liberalize Diet Body mass index is 24.25 kg/m.  Data Reviewed:   CBC: Recent Labs  Lab 11/01/24 1602 11/02/24 0426  WBC 5.3 6.1  NEUTROABS 3.2  --   HGB 11.9* 10.1*  HCT 37.8* 32.0*  MCV 85.5 84.4  PLT 158 131*   Basic Metabolic Panel: Recent Labs  Lab  11/01/24 1602 11/02/24 0426  NA 141 142  K 3.6 3.3*  CL 103 107  CO2 28 29  GLUCOSE 131* 80  BUN 10 10  CREATININE 0.80 0.80  CALCIUM 9.2 8.5*  MG  --  2.2  PHOS  --  4.0   GFR: Estimated Creatinine Clearance: 130.6 mL/min (by C-G formula based on SCr of 0.8 mg/dL). Liver Function Tests: Recent Labs  Lab 11/01/24 1602 11/02/24 0426  AST 16 12*  ALT 7 5  ALKPHOS 80 66  BILITOT 0.6 0.4  PROT 7.5 6.2*  ALBUMIN 4.2 3.6   No results for input(s): LIPASE, AMYLASE in the last 168 hours. No results for input(s): AMMONIA in the last 168 hours. Coagulation Profile: No results for input(s): INR, PROTIME in the last 168 hours. Cardiac Enzymes: No results for input(s): CKTOTAL, CKMB, CKMBINDEX, TROPONINI in the last 168 hours. BNP (last 3 results) No results for input(s): PROBNP in the last 8760 hours. HbA1C: No results for input(s): HGBA1C in the last 72 hours. CBG: No results for input(s): GLUCAP in the last 168 hours. Lipid Profile: No  results for input(s): CHOL, HDL, LDLCALC, TRIG, CHOLHDL, LDLDIRECT in the last 72 hours. Thyroid  Function Tests: No results for input(s): TSH, T4TOTAL, FREET4, T3FREE, THYROIDAB in the last 72 hours. Anemia Panel: No results for input(s): VITAMINB12, FOLATE, FERRITIN, TIBC, IRON, RETICCTPCT in the last 72 hours. Sepsis Labs: No results for input(s): PROCALCITON, LATICACIDVEN in the last 168 hours.  No results found for this or any previous visit (from the past 240 hours).       Radiology Studies: No results found.          LOS: 3 days   Time spent= 36 mins    Deliliah Room, MD [facility]  If 7PM-7AM, please contact night-coverage  11/04/2024, 9:33 AM

## 2024-11-05 ENCOUNTER — Other Ambulatory Visit (HOSPITAL_COMMUNITY): Payer: Self-pay

## 2024-11-05 ENCOUNTER — Telehealth (HOSPITAL_COMMUNITY): Payer: Self-pay | Admitting: Pharmacy Technician

## 2024-11-05 ENCOUNTER — Encounter: Payer: Self-pay | Admitting: Oncology

## 2024-11-05 ENCOUNTER — Other Ambulatory Visit (HOSPITAL_BASED_OUTPATIENT_CLINIC_OR_DEPARTMENT_OTHER): Payer: Self-pay

## 2024-11-05 DIAGNOSIS — M533 Sacrococcygeal disorders, not elsewhere classified: Secondary | ICD-10-CM | POA: Diagnosis not present

## 2024-11-05 MED ORDER — GABAPENTIN 100 MG PO CAPS
200.0000 mg | ORAL_CAPSULE | Freq: Two times a day (BID) | ORAL | 0 refills | Status: AC
  Filled 2024-11-05: qty 30, 8d supply, fill #0

## 2024-11-05 MED ORDER — BUPRENORPHINE HCL-NALOXONE HCL 8-2 MG SL FILM
1.0000 | ORAL_FILM | Freq: Three times a day (TID) | SUBLINGUAL | 0 refills | Status: AC
  Filled 2024-11-05: qty 90, 30d supply, fill #0

## 2024-11-05 MED ORDER — TAMSULOSIN HCL 0.4 MG PO CAPS
0.4000 mg | ORAL_CAPSULE | Freq: Every day | ORAL | 0 refills | Status: AC
  Filled 2024-11-05: qty 30, 30d supply, fill #0

## 2024-11-05 MED ORDER — GENTEAL TEARS 0.1-0.2-0.3 % OP SOLN
1.0000 [drp] | OPHTHALMIC | 0 refills | Status: AC | PRN
  Filled 2024-11-05: qty 15, 90d supply, fill #0

## 2024-11-05 MED ORDER — ADULT MULTIVITAMIN W/MINERALS CH
1.0000 | ORAL_TABLET | Freq: Every day | ORAL | 0 refills | Status: AC
  Filled 2024-11-05: qty 30, 30d supply, fill #0

## 2024-11-05 MED ORDER — NAPHAZOLINE-PHENIRAMINE 0.025-0.3 % OP SOLN
1.0000 [drp] | Freq: Four times a day (QID) | OPHTHALMIC | 0 refills | Status: AC | PRN
  Filled 2024-11-05: qty 15, 75d supply, fill #0

## 2024-11-05 NOTE — Discharge Summary (Addendum)
 Physician Discharge Summary   Patient: Carlos Sullivan MRN: [id number] DOB: [birth year]  Admit date:     11/01/2024  Discharge date: 11/05/24  Discharge Physician: Deliliah Room   PCP: Patient, [name]   Recommendations at discharge:    F/u with PCP on the scheduled appointment.  F/u with oncology. Call to make an appointment with Dr Davonna.  Discharge Diagnoses: Principal Problem:   Sacral mass Active Problems:   Protein-calorie malnutrition, severe    Hospital Course:  53 y.o. male with medical history significant of hypertension, opioid use disorder, LLE DVT s/p thrombectomy and unclear history of intestinal mass in 2018 who presents to the emergency department due to back pain and difficulty in urination.  Patient endorsed chronic back pain with right-sided sciatica, but complained of few days onset of difficulty in urination in which he needed to bear down sometimes just to be able to obtain some dribbling of urine.  He complained of numbness from the waist down including groin and endorsed an episode of bowel incontinence. Neurosurgery [facility]) was consulted and stated that no acute intervention was needed from their perspective at this point in time.  Oncology consulted. CT angiography chest with contrast showed no evidence of pulmonary embolus, no acute intrathoracic process MRI lumbar spine without contrast showed mildly increased size of a large (8.5 x 2.8 x 5.7 cm) mass along the dorsal aspect of the sacrum which fills the sacral canal and sacral foramina. Similar multilevel degenerative changes and foraminal stenosis  Chronic bilateral low back pain with bilateral sciatica possibly due to sacral mass Started on gabapentin 200 mg PO BID on 11/6 for b/l LE neuropathy symptoms. Refilled suboxone  for a month supply.   Urinary retention possibly due to above Continue Flomax  Presumed saddle anesthesia Likely secondary to underlying sacral mass/tumor Neurosurgery  was consulted and there was no indication for any surgical intervention at this time per ED PA Spoke to Dr Autumn as well as Dr Davonna on 11/5. Outpatient f/u with oncology for octreotide injections and possible radiation therapy.   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 I last saw him in June. He was living in a halfway house at that time and had a lot of social issues and could not come to the appointments as scheduled and he has been since lost to follow-up. Though it was well-differentiated, because of the tumor burden, after discussion in GI tumor conference, plan made to proceed with octreotide injections and radiation treatments.  He will f/u with Dr Davonna at [facility] center.      Consultants: Oncology Procedures performed: None  Disposition: Home Diet recommendation:  Regular diet DISCHARGE MEDICATION: Allergies as of 11/05/2024       Reactions   Bee Venom Anaphylaxis        Medication List     STOP taking these medications    traZODone 50 MG tablet Commonly known as: DESYREL   Xarelto  Starter Pack Generic drug: Rivaroxaban  Starter Pack (15 mg and 20 mg)       TAKE these medications    acetaminophen  325 MG tablet Commonly known as: TYLENOL  Take 650 mg by mouth every 6 (six) hours as needed for moderate pain (pain score 4-6).   artificial tears ophthalmic solution Place 1 drop into both eyes as needed for dry eyes.  Buprenorphine  HCl-Naloxone  HCl 8-2 MG Film Place 1 Film under the tongue in the morning, at noon, and at bedtime.   gabapentin 100 MG capsule Commonly known as: NEURONTIN Take 2 capsules (200 mg total) by mouth 2 (two) times daily.   ibuprofen  800 MG tablet Commonly known as: ADVIL  Take 800 mg by mouth every 8 (eight) hours as needed.    multivitamin with minerals Tabs tablet Take 1 tablet by mouth daily. Start taking on: November 06, 2024   naphazoline-pheniramine 0.025-0.3 % ophthalmic solution Commonly known as: NAPHCON-A Place 1 drop into both eyes 4 (four) times daily as needed for eye irritation.   tamsulosin 0.4 MG Caps capsule Commonly known as: FLOMAX Take 1 capsule (0.4 mg total) by mouth daily. Start taking on: November 06, 2024        Follow-up Information     [facility]. Go on 12/03/2024.   Why: You have an appointment on Friday, 12/03/24 at 1:20pm to establish primary care services. Please arrive 15 minutes early to complete new patient paperwork. If you need to cancel or reschedule appointment please call 24 hours in advance. Contact information: [address] [place]  [address] [phone]        Davonna Siad, MD. Call in 1 week(s).   Specialty: Oncology Contact information: [address] [place] [address] [phone]                Discharge Exam: [name]   11/01/24 1509 11/01/24 2300  Weight: 90.7 kg 88 kg   Constitutional: NAD, calm, comfortable Eyes: PERRL, lids and conjunctivae normal ENMT: Mucous membranes are moist. Posterior pharynx clear of any exudate or lesions.Normal dentition.  Neck: normal, supple, no masses, no thyromegaly Respiratory: clear to auscultation bilaterally, no wheezing, no crackles. Normal respiratory effort. No accessory muscle use.  Cardiovascular: Regular rate and rhythm, no murmurs / rubs / gallops. No extremity edema. 2+ pedal pulses. No carotid bruits.  Abdomen: no tenderness, no masses palpated. No hepatosplenomegaly. Bowel sounds positive.  Musculoskeletal: no clubbing / cyanosis. No joint deformity upper and lower extremities. Good ROM, no contractures. Normal muscle tone.  Skin: no rashes, lesions, ulcers. No induration Neurologic: CN 2-12 grossly intact. Sensation intact, DTR  normal. Strength 5/5 x all 4 extremities.  Psychiatric: Normal judgment and insight. Alert and oriented x 3. Normal mood.    Condition at discharge: good  The results of significant diagnostics from this hospitalization (including imaging, microbiology, ancillary and laboratory) are listed below for reference.   Imaging Studies: CT Angio Chest PE W and/or Wo Contrast Result Date: 11/01/2024 CLINICAL DATA:  History of spinal neuroendocrine tumor, prior DVT, concern for pulmonary embolus EXAM: CT ANGIOGRAPHY CHEST WITH CONTRAST TECHNIQUE: Multidetector CT imaging of the chest was performed using the standard protocol during bolus administration of intravenous contrast. Multiplanar CT image reconstructions and MIPs were obtained to evaluate the vascular anatomy. RADIATION DOSE REDUCTION: This exam was performed according to the departmental dose-optimization program which includes automated exposure control, adjustment of the mA and/or kV according to patient size and/or use of iterative reconstruction technique. CONTRAST:  75mL OMNIPAQUE  IOHEXOL  350 MG/ML SOLN COMPARISON:  05/13/2024, 06/02/2024 FINDINGS: Cardiovascular: This is a technically adequate evaluation of the pulmonary vasculature. No filling defects or pulmonary emboli. The heart is unremarkable without pericardial effusion. Normal caliber of the thoracic aorta. Mediastinum/Nodes: No enlarged mediastinal, hilar, or axillary lymph nodes. Thyroid  gland, trachea, and esophagus demonstrate no significant findings. Lungs/Pleura: No acute airspace  disease, effusion, or pneumothorax. Central airways are widely patent. Upper Abdomen: No acute abnormality. Musculoskeletal: There are no acute displaced fractures. The known metastatic lesion within the right T12 pedicle is not readily apparent by CT. Reconstructed images demonstrate no additional findings. Review of the MIP images confirms the above findings. IMPRESSION: 1. No evidence of pulmonary embolus.  2. No acute intrathoracic process. 3. The known T12 metastatic lesion seen on prior PET scan is not readily apparent by CT. Electronically Signed   By: Ozell Daring M.D.   On: 11/01/2024 21:40   MR LUMBAR SPINE WO CONTRAST Result Date: 11/01/2024 EXAM: MRI Lumbar Spine 11/01/2024 06:49:17 PM TECHNIQUE: Multiplanar multisequence MRI of the lumbar spine was performed without the administration of intravenous contrast. COMPARISON: MRI lumbar spine May 12, 2024 CLINICAL HISTORY: Low back pain, cauda equina syndrome suspected FINDINGS: Segmentation: Same numbering as on the prior with partially lumbarized S1 vertebral body. BONES AND ALIGNMENT: Mildly decreased conspicuity of STIR hyperintense T12 and L5 vertebral body lesions which are likely similar in size. The absence of contrast precludes evaluation for enhancing lesions. Grade 1 anterolisthesis of L5 on S1. SPINAL CORD: The conus terminates normally. SOFT TISSUES: Similar versus mildly increased size of a large (8.5 x 2.8 x 5.7 cm) mass along the dorsal aspect of the sacrum which fills the sacral canal and sacral foramina. L1-L2: No significant disc herniation. No spinal canal stenosis or neural foraminal narrowing. L2-L3: Arthropathy and ligamentum flavum thickening. Mild right subarticular recess stenosis and bilateral foraminal stenosis. No significant canal stenosis. L3-L4: Disc bulge and bilateral facet arthropathy. Similar moderate right subarticular recess stenosis and moderate right greater than left foraminal stenosis. Patent central canal. L4-L5: Right disc bulge with ligamentum flavum thickening. Bilateral facet arthropathy. Similar mild canal stenosis. Similar mild foraminal stenosis. L5-S1: Grade 1 anterolisthesis and uncovering of the disc. The sacral mass described above fills the canal. Similar moderate bilateral foraminal stenosis. S1-S2:  The sacral mass described above fills the canal. IMPRESSION: 1. Mildly decreased conspicuity of STIR  hyperintense T12 and L5 vertebral body lesions which are likely similar in size. 2. Similar versus mildly increased size of a large (8.5 x 2.8 x 5.7 cm) mass along the dorsal aspect of the sacrum which fills the sacral canal and sacral foramina, partially imaged. 3. Similar multilevel degenerative changes and foraminal stenosis, as detailed above. Electronically signed by: Gilmore Molt MD 11/01/2024 08:45 PM EST RP Workstation: [id number]    Microbiology: No results found for this or any previous visit.  Labs: CBC: Recent Labs  Lab 11/01/24 1602 11/02/24 0426  WBC 5.3 6.1  NEUTROABS 3.2  --   HGB 11.9* 10.1*  HCT 37.8* 32.0*  MCV 85.5 84.4  PLT 158 131*   Basic Metabolic Panel: Recent Labs  Lab 11/01/24 1602 11/02/24 0426  NA 141 142  K 3.6 3.3*  CL 103 107  CO2 28 29  GLUCOSE 131* 80  BUN 10 10  CREATININE 0.80 0.80  CALCIUM 9.2 8.5*  MG  --  2.2  PHOS  --  4.0   Liver Function Tests: Recent Labs  Lab 11/01/24 1602 11/02/24 0426  AST 16 12*  ALT 7 5  ALKPHOS 80 66  BILITOT 0.6 0.4  PROT 7.5 6.2*  ALBUMIN 4.2 3.6   CBG: No results for input(s): GLUCAP in the last 168 hours.  Discharge time spent: 40 minutes.  Signed: Deliliah Room, MD [facility] 11/05/2024

## 2024-11-05 NOTE — Telephone Encounter (Signed)
 Patient Product/process Development Scientist completed.    The patient is insured through [facility]. Patient has [employer], may use a copay card, and/or apply for patient assistance if available.    Ran test claim for buprenorphine -Naloxone  8.--2 mg tablets and the current 30 day co-pay is $0.00.  Ran test claim for buprenorphine -Naloxone  8.--2 mg Film and the current 30 day co-pay is $0.00.  This test claim was processed through [facility]- copay amounts may vary at other pharmacies due to pharmacy/plan contracts, or as the patient moves through the different stages of their insurance plan.     Reyes Sharps, CPHT Pharmacy Technician Patient Advocate Specialist Lead [facility] Patient Advocate Team Direct Number: [phone]  Fax: [phone]

## 2024-11-08 ENCOUNTER — Other Ambulatory Visit (HOSPITAL_BASED_OUTPATIENT_CLINIC_OR_DEPARTMENT_OTHER): Payer: Self-pay

## 2024-11-15 ENCOUNTER — Inpatient Hospital Stay: Admitting: Oncology

## 2024-11-15 ENCOUNTER — Inpatient Hospital Stay

## 2024-11-15 ENCOUNTER — Encounter: Payer: Self-pay | Admitting: Oncology

## 2024-11-15 ENCOUNTER — Other Ambulatory Visit (HOSPITAL_BASED_OUTPATIENT_CLINIC_OR_DEPARTMENT_OTHER): Payer: Self-pay

## 2024-11-19 ENCOUNTER — Other Ambulatory Visit (HOSPITAL_BASED_OUTPATIENT_CLINIC_OR_DEPARTMENT_OTHER): Payer: Self-pay

## 2024-11-29 ENCOUNTER — Other Ambulatory Visit: Payer: Self-pay

## 2024-11-29 ENCOUNTER — Inpatient Hospital Stay

## 2024-11-29 ENCOUNTER — Inpatient Hospital Stay: Attending: Oncology

## 2024-11-29 ENCOUNTER — Inpatient Hospital Stay: Admitting: Oncology

## 2024-11-29 DIAGNOSIS — C7A8 Other malignant neuroendocrine tumors: Secondary | ICD-10-CM

## 2024-12-03 ENCOUNTER — Ambulatory Visit: Payer: Self-pay

## 2025-02-01 ENCOUNTER — Encounter: Payer: Self-pay | Admitting: Oncology

## 2025-04-12 ENCOUNTER — Ambulatory Visit
